# Patient Record
Sex: Female | Born: 1955 | Race: Black or African American | Hispanic: No | Marital: Married | Smoking: Never smoker
Health system: Southern US, Community
[De-identification: ages and names within clinical notes are randomized; demographics above are authoritative.]

## PROBLEM LIST (undated history)

## (undated) DIAGNOSIS — F319 Bipolar disorder, unspecified: Secondary | ICD-10-CM

## (undated) DIAGNOSIS — F209 Schizophrenia, unspecified: Secondary | ICD-10-CM

## (undated) DIAGNOSIS — E785 Hyperlipidemia, unspecified: Secondary | ICD-10-CM

## (undated) DIAGNOSIS — I10 Essential (primary) hypertension: Secondary | ICD-10-CM

## (undated) HISTORY — PX: TIBIA FRACTURE SURGERY: SHX806

## (undated) HISTORY — PX: TUBAL LIGATION: SHX77

## (undated) HISTORY — PX: OTHER SURGICAL HISTORY: SHX169

---

## 2011-08-26 ENCOUNTER — Encounter (HOSPITAL_COMMUNITY): Payer: Self-pay | Admitting: *Deleted

## 2011-08-26 ENCOUNTER — Emergency Department (HOSPITAL_COMMUNITY)
Admission: EM | Admit: 2011-08-26 | Discharge: 2011-08-26 | Disposition: A | Discharge status: Home / Self Care | Payer: Medicare (Managed Care) | Attending: Emergency Medicine | Admitting: Emergency Medicine

## 2011-08-26 ENCOUNTER — Emergency Department (HOSPITAL_COMMUNITY): Payer: Medicare (Managed Care)

## 2011-08-26 DIAGNOSIS — M25579 Pain in unspecified ankle and joints of unspecified foot: Secondary | ICD-10-CM | POA: Insufficient documentation

## 2011-08-26 DIAGNOSIS — S92309A Fracture of unspecified metatarsal bone(s), unspecified foot, initial encounter for closed fracture: Secondary | ICD-10-CM | POA: Insufficient documentation

## 2011-08-26 DIAGNOSIS — M79609 Pain in unspecified limb: Secondary | ICD-10-CM | POA: Insufficient documentation

## 2011-08-26 DIAGNOSIS — S93409A Sprain of unspecified ligament of unspecified ankle, initial encounter: Secondary | ICD-10-CM | POA: Insufficient documentation

## 2011-08-26 DIAGNOSIS — S93402A Sprain of unspecified ligament of left ankle, initial encounter: Secondary | ICD-10-CM

## 2011-08-26 DIAGNOSIS — W19XXXA Unspecified fall, initial encounter: Secondary | ICD-10-CM

## 2011-08-26 DIAGNOSIS — F319 Bipolar disorder, unspecified: Secondary | ICD-10-CM | POA: Insufficient documentation

## 2011-08-26 DIAGNOSIS — M25519 Pain in unspecified shoulder: Secondary | ICD-10-CM | POA: Insufficient documentation

## 2011-08-26 DIAGNOSIS — I10 Essential (primary) hypertension: Secondary | ICD-10-CM | POA: Insufficient documentation

## 2011-08-26 DIAGNOSIS — W108XXA Fall (on) (from) other stairs and steps, initial encounter: Secondary | ICD-10-CM | POA: Insufficient documentation

## 2011-08-26 DIAGNOSIS — S43402A Unspecified sprain of left shoulder joint, initial encounter: Secondary | ICD-10-CM

## 2011-08-26 DIAGNOSIS — IMO0002 Reserved for concepts with insufficient information to code with codable children: Secondary | ICD-10-CM | POA: Insufficient documentation

## 2011-08-26 HISTORY — DX: Bipolar disorder, unspecified: F31.9

## 2011-08-26 HISTORY — DX: Essential (primary) hypertension: I10

## 2011-08-26 MED ORDER — HYDROCODONE-ACETAMINOPHEN 5-325 MG PO TABS: 1.0000 | ORAL_TABLET | ORAL | Status: AC | PRN

## 2011-08-26 MED ORDER — IBUPROFEN 800 MG PO TABS
800.0000 mg | ORAL_TABLET | Freq: Once | ORAL | Status: DC
  Filled 2011-08-26: qty 1

## 2011-08-26 MED ORDER — HYDROCODONE-ACETAMINOPHEN 5-325 MG PO TABS
1.0000 | ORAL_TABLET | Freq: Once | ORAL | Status: AC
  Administered 2011-08-26: 1 via ORAL
  Filled 2011-08-26: qty 1

## 2011-08-26 MED ORDER — DIAZEPAM 5 MG PO TABS
5.0000 mg | ORAL_TABLET | Freq: Once | ORAL | Status: AC
  Administered 2011-08-26: 5 mg via ORAL
  Filled 2011-08-26: qty 1

## 2011-08-26 NOTE — Progress Notes (Signed)
Orthopedic Tech Progress Note Patient Details:  Dorothy Moran 1956/04/23 [id number]  Other Ortho Devices Type of Ortho Device: Postop boot;Crutches Ortho Device Location: left foot Ortho Device Interventions: Application   Nikki Dom 08/26/2011, 11:15 PM

## 2011-08-26 NOTE — Discharge Instructions (Signed)
Please review the instructions below. The x-ray tonight shows that you do have a broken bone in your left foot just beneath your left little toe. This break is nondisplaced and will probably heal without problems. The x-ray of your shoulder anf your left ankle were negative for any broken bones. Keep the ace wrap and postop boot in place and use crutches to assist with weightbearing until the pain in your left foot improves. Review the RICE instructions below. Take the medication for pain as directed. You'll need to call Dr. Shelle Iron on Monday to arrange follow up for a recheck. Return if you have worsening symptoms otherwise followup as discussed.     Ankle Sprain An ankle sprain happens when the bands of tissue that hold the ankle bones together (ligaments) stretch too much and tear.  HOME CARE   Put ice on the ankle for 15 to 20 minutes, 3 to 4 times a day.   Put ice in a plastic bag.   Place a towel between your skin and the bag.   You may stop icing when the puffiness (swelling) goes down.   Raise (elevate) the injured ankle to lessen puffiness.   Use crutches if your doctor tells you to. Use them until you can walk without pain.   If a plaster splint was applied:   Rest the plaster splint on nothing harder than a pillow for 24 hours.   Do not put weight on it.   Do not get it wet.   Take it off to shower or bathe.   Follow up with your doctor.   Use an elastic wrap for support. Take the wrap off if the toes lose feeling (numb), tingle, or turn cold or blue.   If an air splint was applied:   Add or release air to make it comfortable.   Take it off at night and to shower and bathe.   Wiggle your toes while wearing the air splint.   Only take medicine as told by your doctor.   Do not drive until your doctor says it is okay.  GET HELP RIGHT AWAY IF:   You have more bruising, puffiness, or pain.   Your toes feel cold.   Your medicine does not help lessen your  pain.   You are losing feeling in your toes or they turn blue.   You have severe pain.  MAKE SURE YOU:   Understand these instructions.   Will watch your condition.   Will get help right away if you are not doing well or get worse.  Document Released: 11/14/2007 Document Revised: 05/17/2011 Document Reviewed: 11/14/2007 [facility] Patient Information 2012 [place], [place].Foot Fracture A fractured foot is a broken bone in your foot. These fractures are usually caused by twisting or crush injuries. Some foot fractures are stress fractures which are due to excess walking or exercise. If the bones are in a good position, foot fractures will usually heal in about 6 weeks. You should keep your foot elevated for the next 2 to 4 days and apply ice packs to the area of the injury for 20 to 30 minutes every 2 to 3 hours until the swelling and pain get better. If you have been placed in a cast or splint, keep it on until you have been checked by your caregiver. Do not walk on a broken foot until bearing weight is relatively painless. Often a cast or podiatric shoe with a stiff sole is used to allow early walking.  Repeat X-rays are often needed in 3 to 6 weeks to make sure the fracture is healing. Follow up with your caregiver as recommended.  SEEK IMMEDIATE MEDICAL CARE IF:  You have increased pain, or your toes become cold, numb, or pale.  MAKE SURE YOU:   Understand these instructions.   Will watch your condition.   Will get help right away if you are not doing well or get worse.  Document Released: 07/05/2004 Document Revised: 05/17/2011 Document Reviewed: 06/30/2008 [facility] Patient Information 2012 [place], [place].Metatarsal Fracture, Undisplaced A metatarsal fracture is a break in the bone(s) of the foot. These are the bones of the foot that connect your toes to the bones of the ankle. DIAGNOSIS  The diagnoses of these fractures are usually made with X-rays. If there are problems in the  forefoot and x-rays are normal a later bone scan will usually make the diagnosis.  TREATMENT AND HOME CARE INSTRUCTIONS  Treatment may or may not include a cast or walking shoe. When casts are needed the use is usually for short periods of time so as not to slow down healing with muscle wasting (atrophy).   Activities should be stopped until further advised by your caregiver.   Wear shoes with adequate shock absorbing capabilities and stiff soles.   Alternative exercise may be undertaken while waiting for healing. These may include bicycling and swimming, or as your caregiver suggests.   It is important to keep all follow-up visits or specialty referrals. The failure to keep these appointments could result in improper bone healing and chronic pain or disability.   Warning: Do not drive a car or operate a motor vehicle until your caregiver specifically tells you it is safe to do so.  IF YOU DO NOT HAVE A CAST OR SPLINT:  You may walk on your injured foot as tolerated or advised.   Do not put any weight on your injured foot for as long as directed by your caregiver. Slowly increase the amount of time you walk on the foot as the pain allows or as advised.   Use crutches until you can bear weight without pain. A gradual increase in weight bearing may help.   Apply ice to the injury for 15 to 20 minutes each hour while awake for the first 2 days. Put the ice in a plastic bag and place a towel between the bag of ice and your skin.   Only take over-the-counter or prescription medicines for pain, discomfort, or fever as directed by your caregiver.  SEEK IMMEDIATE MEDICAL CARE IF:   Your cast gets damaged or breaks.   You have continued severe pain or more swelling than you did before the cast was put on, or the pain is not controlled with medications.   Your skin or nails below the injury turn blue or grey, or feel cold or numb.   There is a bad smell, or new stains or pus-like (purulent)  drainage coming from the cast.  MAKE SURE YOU:   Understand these instructions.   Will watch your condition.   Will get help right away if you are not doing well or get worse.  Document Released: 02/17/2002 Document Revised: 05/17/2011 Document Reviewed: 01/09/2008 Shoulder Sprain A shoulder sprain is the result of damage to the tough, fiber-like tissues (ligaments) that help hold your shoulder in place. The ligaments may be stretched or torn. Besides the main shoulder joint (the ball and socket), there are several smaller joints that connect the bones  in this area. A sprain usually involves one of those joints. Most often it is the acromioclavicular (or AC) joint. That is the joint that connects the collarbone (clavicle) and the shoulder blade (scapula) at the top point of the shoulder blade (acromion). A shoulder sprain is a mild form of what is called a shoulder separation. Recovering from a shoulder sprain may take some time. For some, pain lingers for several months. Most people recover without long term problems. CAUSES   A shoulder sprain is usually caused by some kind of trauma. This might be:   Falling on an outstretched arm.   Being hit hard on the shoulder.   Twisting the arm.   Shoulder sprains are more likely to occur in people who:   Play sports.   Have balance or coordination problems.  SYMPTOMS   Pain when you move your shoulder.   Limited ability to move the shoulder.   Swelling and tenderness on top of the shoulder.   Redness or warmth in the shoulder.   Bruising.   A change in the shape of the shoulder.  DIAGNOSIS  Your healthcare provider may:  Ask about your symptoms.   Ask about recent activity that might have caused those symptoms.   Examine your shoulder. You may be asked to do simple exercises to test movement. The other shoulder will be examined for comparison.   Order some tests that provide a look inside the body. They can show the extent  of the injury. The tests could include:   X-rays.   CT (computed tomography) scan.   MRI (magnetic resonance imaging) scan.  RISKS AND COMPLICATIONS  Loss of full shoulder motion.   Ongoing shoulder pain.  TREATMENT  How long it takes to recover from a shoulder sprain depends on how severe it was. Treatment options may include:  Rest. You should not use the arm or shoulder until it heals.   Ice. For 2 or 3 days after the injury, put an ice pack on the shoulder up to 4 times a day. It should stay on for 15 to 20 minutes each time. Wrap the ice in a towel so it does not touch your skin.   Over-the-counter medicine to relieve pain.   A sling or brace. This will keep the arm still while the shoulder is healing.   Physical therapy or rehabilitation exercises. These will help you regain strength and motion. Ask your healthcare provider when it is OK to begin these exercises.   Surgery. The need for surgery is rare with a sprained shoulder, but some people may need surgery to keep the joint in place and reduce pain.  HOME CARE INSTRUCTIONS   Ask your healthcare provider about what you should and should not do while your shoulder heals.   Make sure you know how to apply ice to the correct area of your shoulder.   Talk with your healthcare provider about which medications should be used for pain and swelling.   If rehabilitation therapy will be needed, ask your healthcare provider to refer you to a therapist. If it is not recommended, then ask about at-home exercises. Find out when exercise should begin.  SEEK MEDICAL CARE IF:  Your pain, swelling, or redness at the joint increases. SEEK IMMEDIATE MEDICAL CARE IF:   You have a fever.   You cannot move your arm or shoulder.  Document Released: 10/14/2008 Document Revised: 05/17/2011 Document Reviewed: 10/14/2008 [employer] Patient Information 2012 [employer], Shoulder Sprain A shoulder  sprain is the result of damage to the tough,  fiber-like tissues (ligaments) that help hold your shoulder in place. The ligaments may be stretched or torn. Besides the main shoulder joint (the ball and socket), there are several smaller joints that connect the bones in this area. A sprain usually involves one of those joints. Most often it is the acromioclavicular (or AC) joint. That is the joint that connects the collarbone (clavicle) and the shoulder blade (scapula) at the top point of the shoulder blade (acromion). A shoulder sprain is a mild form of what is called a shoulder separation. Recovering from a shoulder sprain may take some time. For some, pain lingers for several months. Most people recover without long term problems. CAUSES   A shoulder sprain is usually caused by some kind of trauma. This might be:   Falling on an outstretched arm.   Being hit hard on the shoulder.   Twisting the arm.   Shoulder sprains are more likely to occur in people who:   Play sports.   Have balance or coordination problems.  SYMPTOMS   Pain when you move your shoulder.   Limited ability to move the shoulder.   Swelling and tenderness on top of the shoulder.   Redness or warmth in the shoulder.   Bruising.   A change in the shape of the shoulder.  DIAGNOSIS  Your healthcare provider may:  Ask about your symptoms.   Ask about recent activity that might have caused those symptoms.   Examine your shoulder. You may be asked to do simple exercises to test movement. The other shoulder will be examined for comparison.   Order some tests that provide a look inside the body. They can show the extent of the injury. The tests could include:   X-rays.   CT (computed tomography) scan.   MRI (magnetic resonance imaging) scan.  RISKS AND COMPLICATIONS  Loss of full shoulder motion.   Ongoing shoulder pain.  TREATMENT  How long it takes to recover from a shoulder sprain depends on how severe it was. Treatment options may  include:  Rest. You should not use the arm or shoulder until it heals.   Ice. For 2 or 3 days after the injury, put an ice pack on the shoulder up to 4 times a day. It should stay on for 15 to 20 minutes each time. Wrap the ice in a towel so it does not touch your skin.   Over-the-counter medicine to relieve pain.   A sling or brace. This will keep the arm still while the shoulder is healing.   Physical therapy or rehabilitation exercises. These will help you regain strength and motion. Ask your healthcare provider when it is OK to begin these exercises.   Surgery. The need for surgery is rare with a sprained shoulder, but some people may need surgery to keep the joint in place and reduce pain.  HOME CARE INSTRUCTIONS   Ask your healthcare provider about what you should and should not do while your shoulder heals.   Make sure you know how to apply ice to the correct area of your shoulder.   Talk with your healthcare provider about which medications should be used for pain and swelling.   If rehabilitation therapy will be needed, ask your healthcare provider to refer you to a therapist. If it is not recommended, then ask about at-home exercises. Find out when exercise should begin.  SEEK MEDICAL CARE IF:  Your pain, swelling,  or redness at the joint increases. SEEK IMMEDIATE MEDICAL CARE IF:   You have a fever.   You cannot move your arm or shoulder.  Document Released: 10/14/2008 Document Revised: 05/17/2011 Document Reviewed: 10/14/2008 [facility] Patient Information 2012 [employer].Shoulder Sprain A shoulder sprain is the result of damage to the tough, fiber-like tissues (ligaments) that help hold your shoulder in place. The ligaments may be stretched or torn. Besides the main shoulder joint (the ball and socket), there are several smaller joints that connect the bones in this area. A sprain usually involves one of those joints. Most often it is the acromioclavicular (or AC)  joint. That is the joint that connects the collarbone (clavicle) and the shoulder blade (scapula) at the top point of the shoulder blade (acromion). A shoulder sprain is a mild form of what is called a shoulder separation. Recovering from a shoulder sprain may take some time. For some, pain lingers for several months. Most people recover without long term problems. CAUSES   A shoulder sprain is usually caused by some kind of trauma. This might be:   Falling on an outstretched arm.   Being hit hard on the shoulder.   Twisting the arm.   Shoulder sprains are more likely to occur in people who:   Play sports.   Have balance or coordination problems.  SYMPTOMS   Pain when you move your shoulder.   Limited ability to move the shoulder.   Swelling and tenderness on top of the shoulder.   Redness or warmth in the shoulder.   Bruising.   A change in the shape of the shoulder.  DIAGNOSIS  Your healthcare provider may:  Ask about your symptoms.   Ask about recent activity that might have caused those symptoms.   Examine your shoulder. You may be asked to do simple exercises to test movement. The other shoulder will be examined for comparison.   Order some tests that provide a look inside the body. They can show the extent of the injury. The tests could include:   X-rays.   CT (computed tomography) scan.   MRI (magnetic resonance imaging) scan.  RISKS AND COMPLICATIONS  Loss of full shoulder motion.   Ongoing shoulder pain.  TREATMENT  How long it takes to recover from a shoulder sprain depends on how severe it was. Treatment options may include:  Rest. You should not use the arm or shoulder until it heals.   Ice. For 2 or 3 days after the injury, put an ice pack on the shoulder up to 4 times a day. It should stay on for 15 to 20 minutes each time. Wrap the ice in a towel so it does not touch your skin.   Over-the-counter medicine to relieve pain.   A sling or brace.  This will keep the arm still while the shoulder is healing.   Physical therapy or rehabilitation exercises. These will help you regain strength and motion. Ask your healthcare provider when it is OK to begin these exercises.   Surgery. The need for surgery is rare with a sprained shoulder, but some people may need surgery to keep the joint in place and reduce pain.  HOME CARE INSTRUCTIONS   Ask your healthcare provider about what you should and should not do while your shoulder heals.   Make sure you know how to apply ice to the correct area of your shoulder.   Talk with your healthcare provider about which medications should be used for pain  and swelling.   If rehabilitation therapy will be needed, ask your healthcare provider to refer you to a therapist. If it is not recommended, then ask about at-home exercises. Find out when exercise should begin.  SEEK MEDICAL CARE IF:  Your pain, swelling, or redness at the joint increases. SEEK IMMEDIATE MEDICAL CARE IF:   You have a fever.   You cannot move your arm or shoulder.  Document Released: 10/14/2008 Document Revised: 05/17/2011 Document Reviewed: 10/14/2008 [facility] Patient Information 2012 [place], [place].

## 2011-08-26 NOTE — ED Provider Notes (Signed)
History     CSN: [id number]  Arrival date & time 08/26/11  4098   First MD Initiated Contact with Patient 08/26/11 2051      Chief Complaint  Patient presents with  . Leg Injury    HPI: Patient is a 56 y.o. female presenting with fall. The history is provided by the patient.  Fall The accident occurred yesterday. The fall occurred while walking. She fell from a height of 3 to 5 ft. She landed on concrete. The point of impact was the left shoulder. The pain is present in the left shoulder. The pain is at a severity of 10/10. The pain is severe. She was ambulatory at the scene. There was no entrapment after the fall. There was no drug use involved in the accident. There was no alcohol use involved in the accident. Pertinent negatives include no numbness and no tingling. The symptoms are aggravated by standing and pressure on the injury.  Patient reports that she fell yesterday while walking. States she tripped over a small step that she did not see, fell to her left ultimately landing on her left shoulder. Patient now complains of pain to the left shoulder, left ankle, and foot. Reports previous surgery to the left ankle so she is concerned about reinjury.  Past Medical History  Diagnosis Date  . Hypertension   . Bipolar 1 disorder     No past surgical history on file.  History reviewed. No pertinent family history.  History  Substance Use Topics  . Smoking status: Never Smoker   . Smokeless tobacco: Not on file  . Alcohol Use: No    OB History    Grav Para Term Preterm Abortions TAB SAB Ect Mult Living                  Review of Systems  Constitutional: Negative.   HENT: Negative.   Eyes: Negative.   Respiratory: Negative.   Cardiovascular: Negative.   Gastrointestinal: Negative.   Genitourinary: Negative.   Musculoskeletal: Negative.   Skin: Negative.   Neurological: Negative.  Negative for tingling and numbness.  Hematological: Negative.     Psychiatric/Behavioral: Negative.     Allergies  Ibuprofen and Aspirin  Home Medications   Current Outpatient Rx  Name Route Sig Dispense Refill  . AMLODIPINE BESYLATE 5 MG PO TABS Oral Take 5 mg by mouth daily.    . ASPIRIN 81 MG PO CHEW Oral Chew 81 mg by mouth daily.    Marland Kitchen DIVALPROEX SODIUM ER 500 MG PO TB24 Oral Take 500 mg by mouth daily.    Marland Kitchen ESOMEPRAZOLE MAGNESIUM 40 MG PO CPDR Oral Take 40 mg by mouth daily before breakfast.    . ISOSORBIDE DINITRATE 30 MG PO TABS Oral Take 30 mg by mouth 2 (two) times daily.    . ADULT MULTIVITAMIN W/MINERALS CH Oral Take 1 tablet by mouth daily.    . QUETIAPINE FUMARATE 100 MG PO TABS Oral Take 100 mg by mouth at bedtime.    Marland Kitchen ROSUVASTATIN CALCIUM 20 MG PO TABS Oral Take 20 mg by mouth daily.    . TRAMADOL HCL 50 MG PO TABS Oral Take 50 mg by mouth every 6 (six) hours as needed. For pain    . TRAZODONE HCL 50 MG PO TABS Oral Take 50 mg by mouth at bedtime.    . VENLAFAXINE HCL ER 150 MG PO CP24 Oral Take 150 mg by mouth daily.      BP 139/88  Pulse  78  Temp(Src) 97.9 F (36.6 C) (Oral)  Resp 16  SpO2 95%  Physical Exam  Constitutional: She is oriented to person, place, and time. She appears well-developed and well-nourished.  HENT:  Head: Normocephalic and atraumatic.  Eyes: Conjunctivae are normal.  Neck: Neck supple.  Cardiovascular: Normal rate and regular rhythm.   Pulmonary/Chest: Effort normal and breath sounds normal.  Abdominal: Soft. Bowel sounds are normal.  Musculoskeletal: Normal range of motion.       Arms:      Feet:       Patient reports TTP to left shoulder and pain with passive range of motion.  Neurological: She is alert and oriented to person, place, and time.  Skin: Skin is warm and dry. No erythema.  Psychiatric: She has a normal mood and affect.    ED Course  Procedures   Patient reports pain improved with medication. Findings and clinical impression discussed with patient. Will place an Ace wrap  and postop boot and the patient for crutches to assist in weightbearing. Will plan for discharge home with medication for pain and or the referral. Patient agreeable with plan. I have also discussed with patient Dr. Ignacia Palma who is in agreement with plan.  Labs Reviewed - No data to display No results found.   No diagnosis found.    MDM  HPI/PE and clinical impression discussed   1. Fra mechanical fall  2. Fracture of fifth metatarsal ( nondisplaced)  3. Left ankle sprain 4. Left shoulder sprain  ACE and postop boot placed to left ankle/foot and patient fitted for crutches. She was discharged home with medication for pain or the referral.   Medical screening examination/treatment/procedure(s) were performed by non-physician practitioner and as supervising physician I was immediately available for consultation/collaboration. I reviewed pt's x-ray with [name], and advised treatment. Dorothy Moran, M.D.      Dorothy Kayser Schorr, NP 08/26/11 2328  Carleene Cooper III, MD 08/28/11 1215

## 2011-08-26 NOTE — ED Notes (Signed)
She fell yesterday and she has pain in her lt shoulder and her lt lower extremity pain

## 2013-01-10 IMAGING — CR DG SHOULDER 2+V*L*
3 series · 3 of 3 positions shown · non-contrast
Comparison: None

CLINICAL DATA: Leg injury

LEFT SHOULDER - 2+ VIEW

[w shoulder ap internal left]
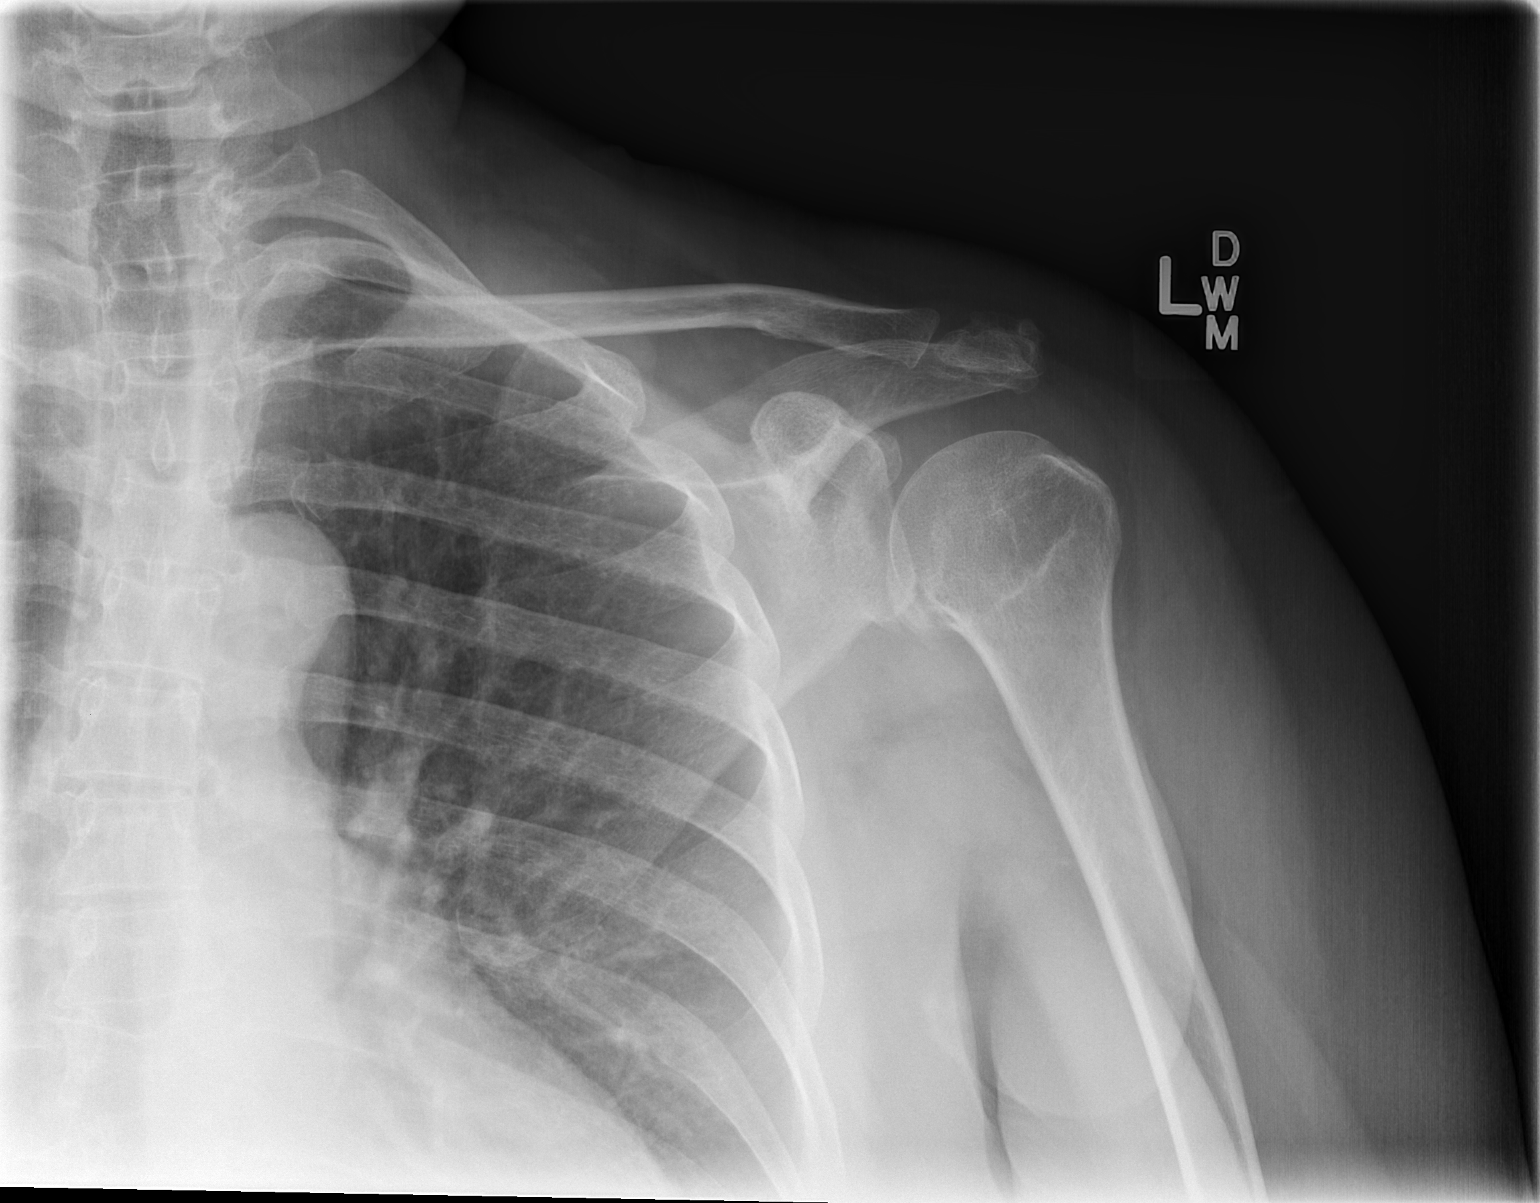

[w shoulder ap external left]
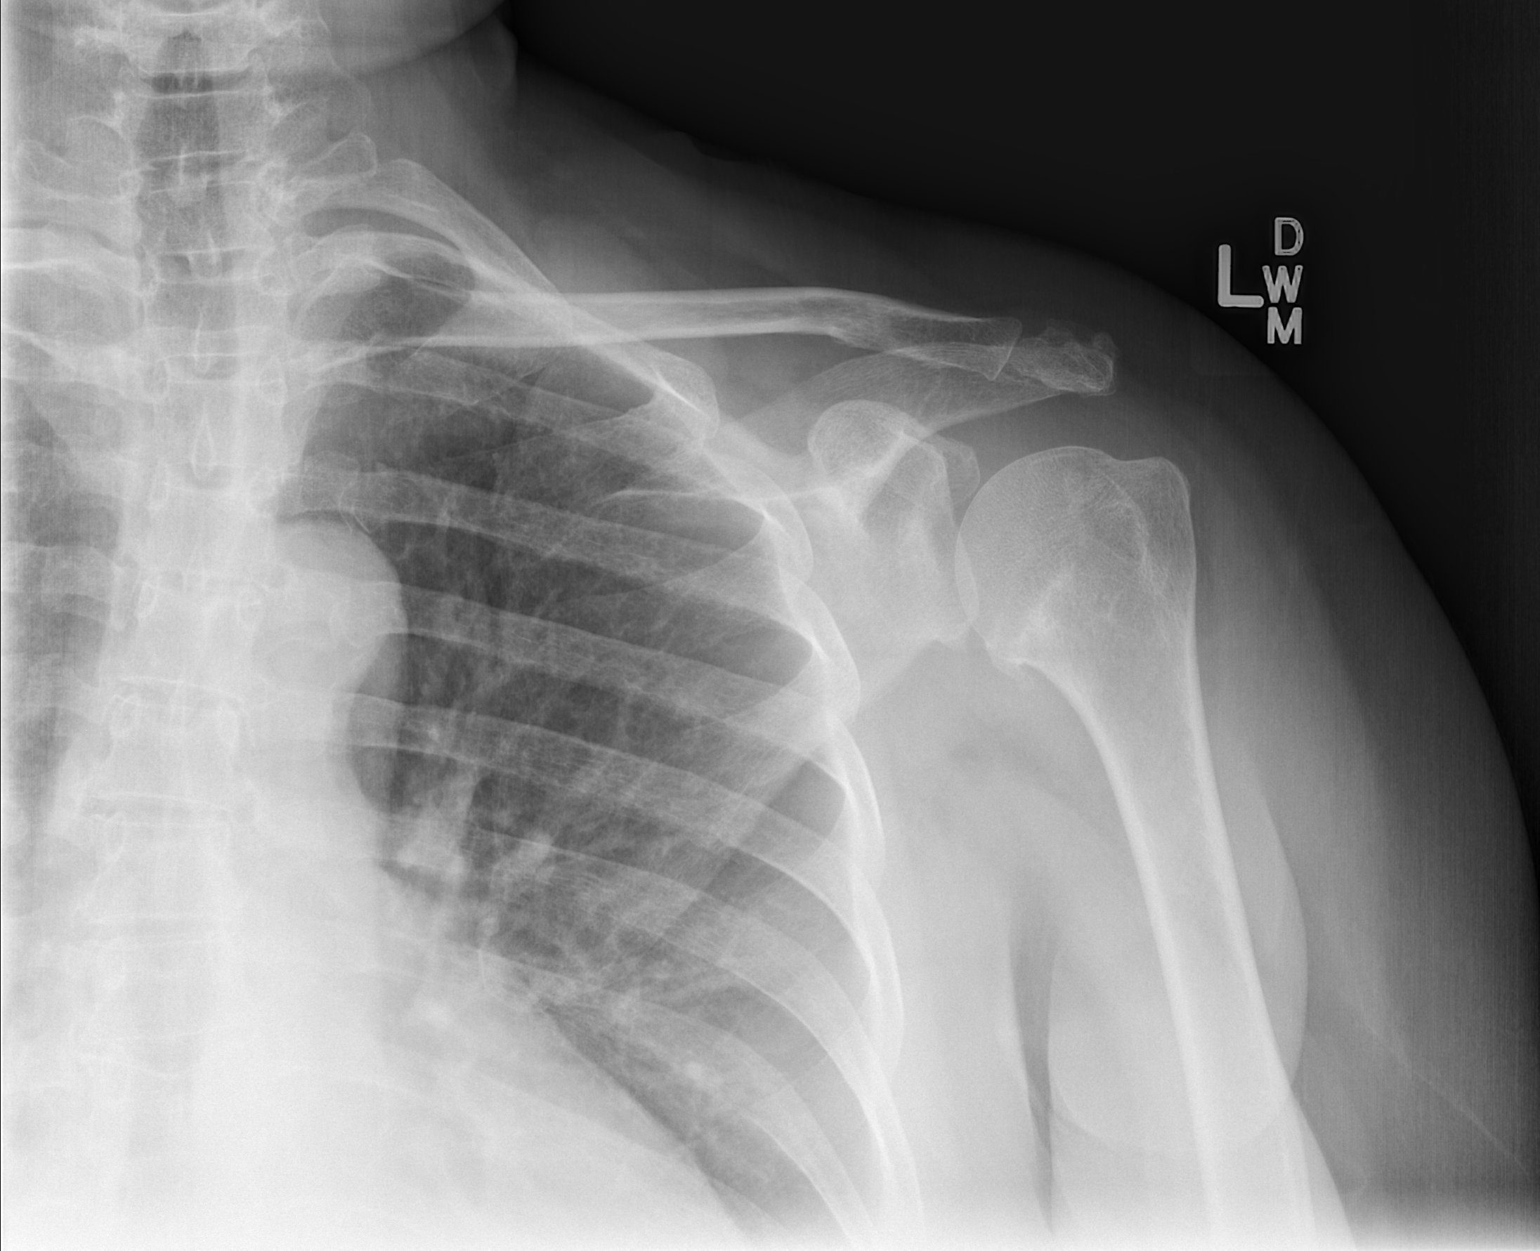

[w shoulder y view left]
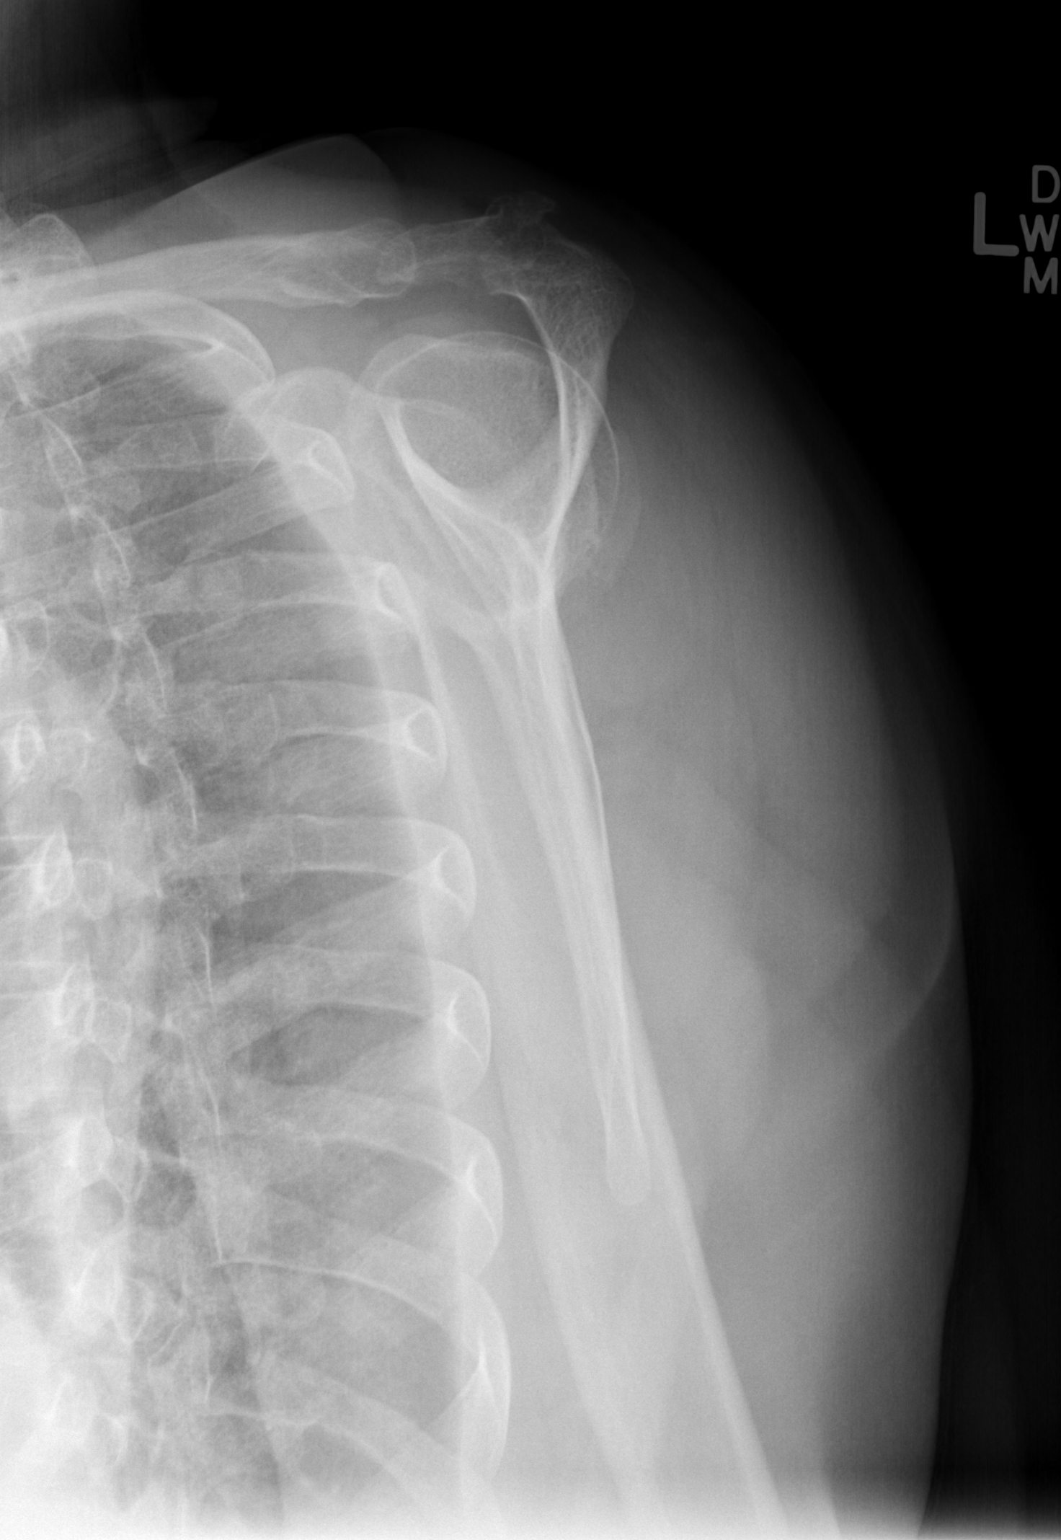

[3 of 3 positions shown; findings below may reference images not displayed]

FINDINGS: Degenerative type changes are noted involving the AC
joint and glenohumeral joint.

No acute fracture or subluxation identified.

No radiopaque foreign bodies or soft tissue calcifications
identified.
IMPRESSION: 1.  No acute findings.
2.  Degenerative joint disease.

## 2013-01-10 IMAGING — CR DG ANKLE COMPLETE 3+V*L*
3 series · 3 of 3 positions shown · non-contrast
Comparison: None

CLINICAL DATA: Leg injury

LEFT ANKLE COMPLETE - 3+ VIEW

[t ankle joint ap left]
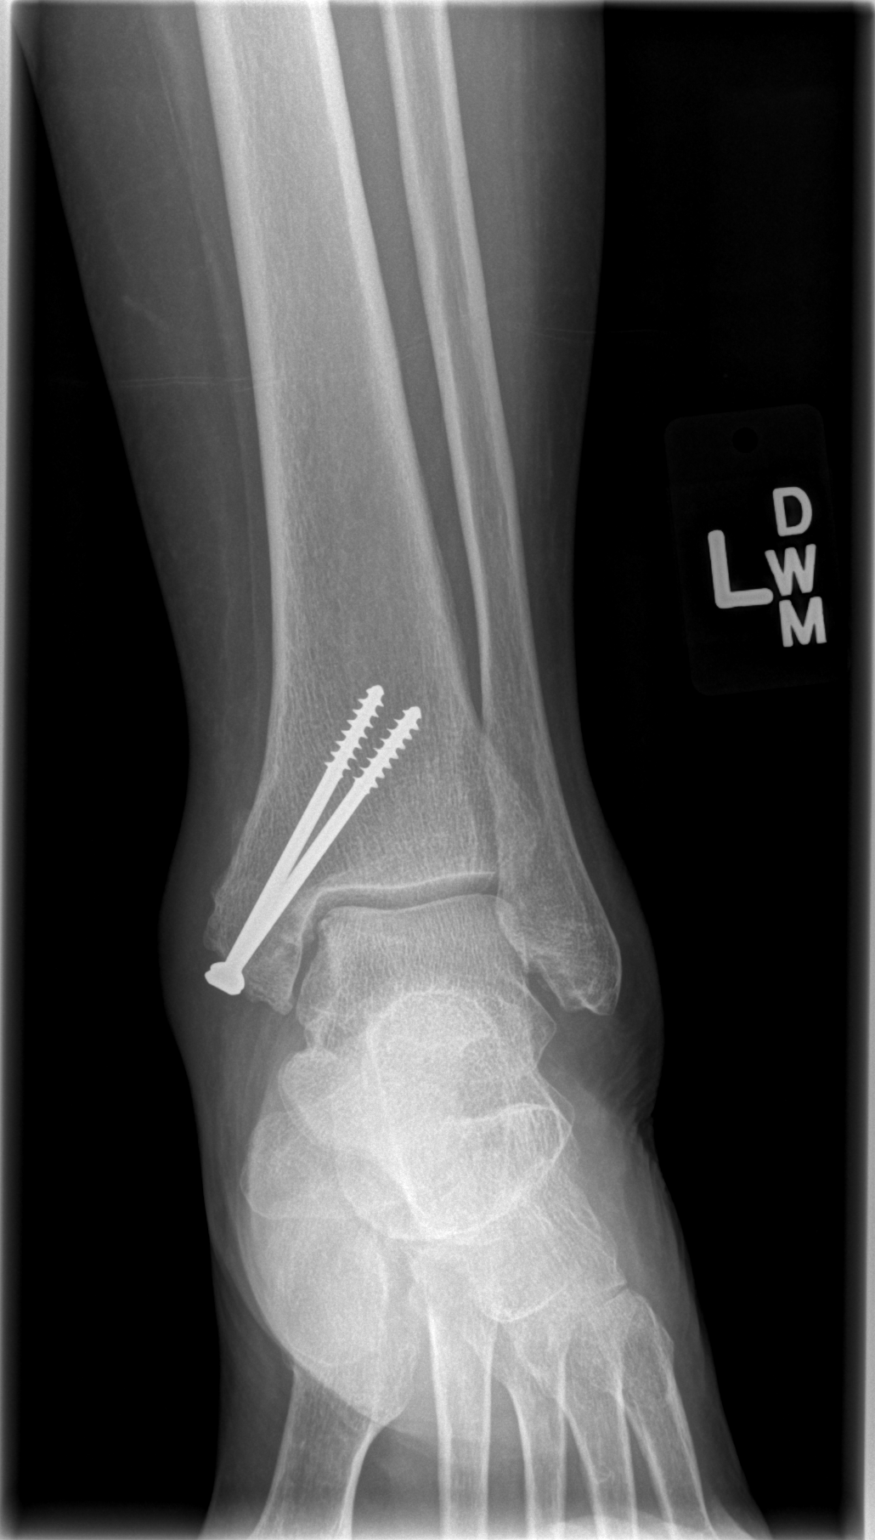

[t ankle joint oblique left]
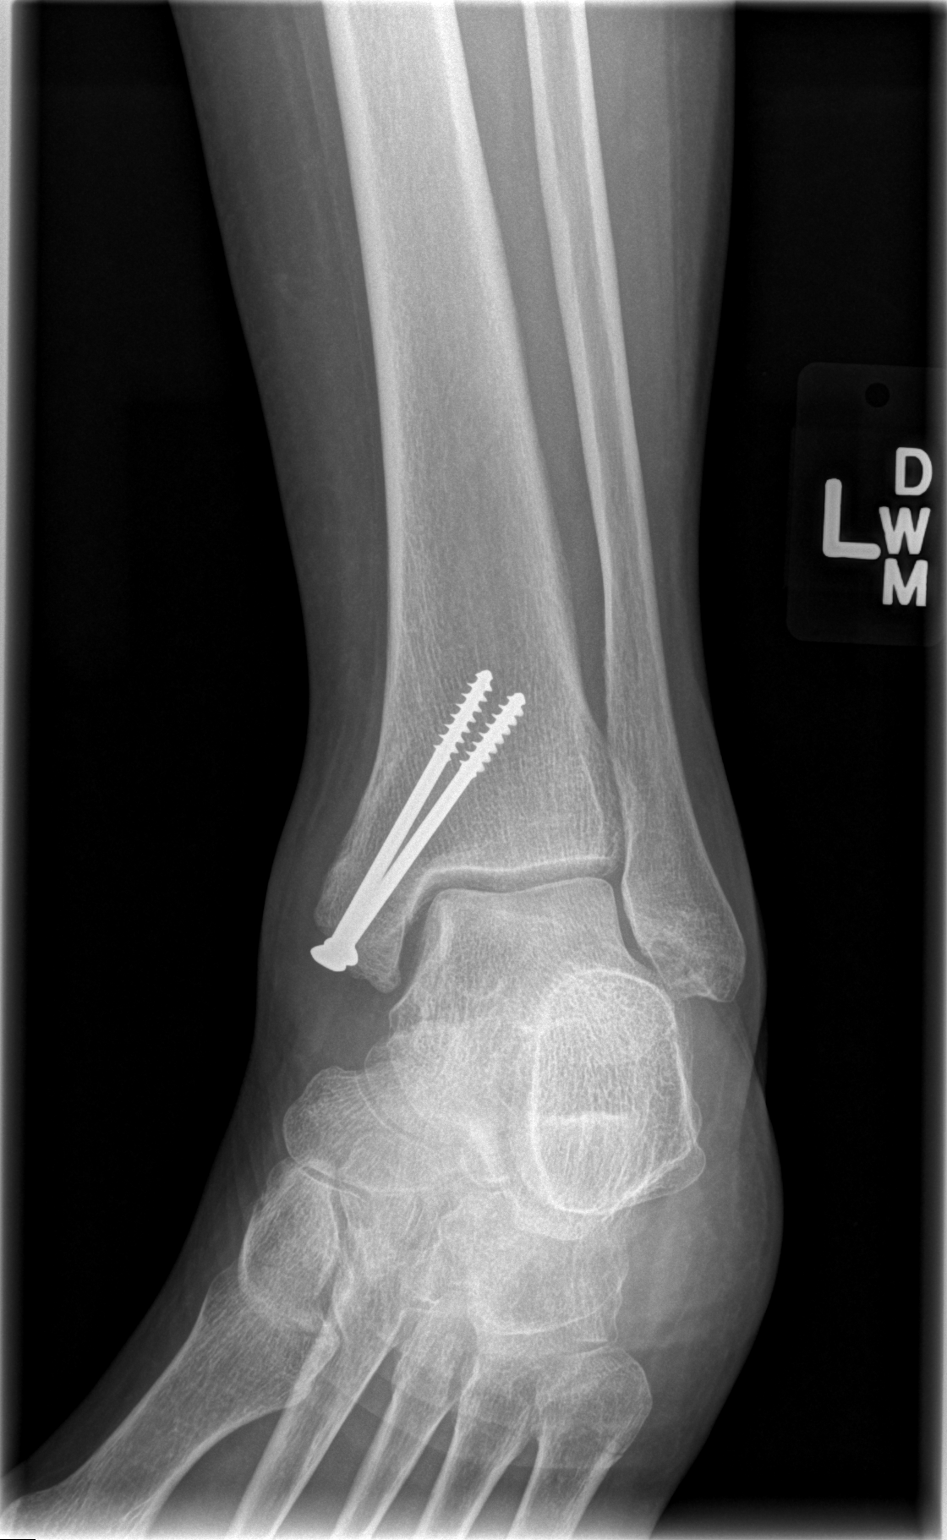

[t ankle joint lat left]
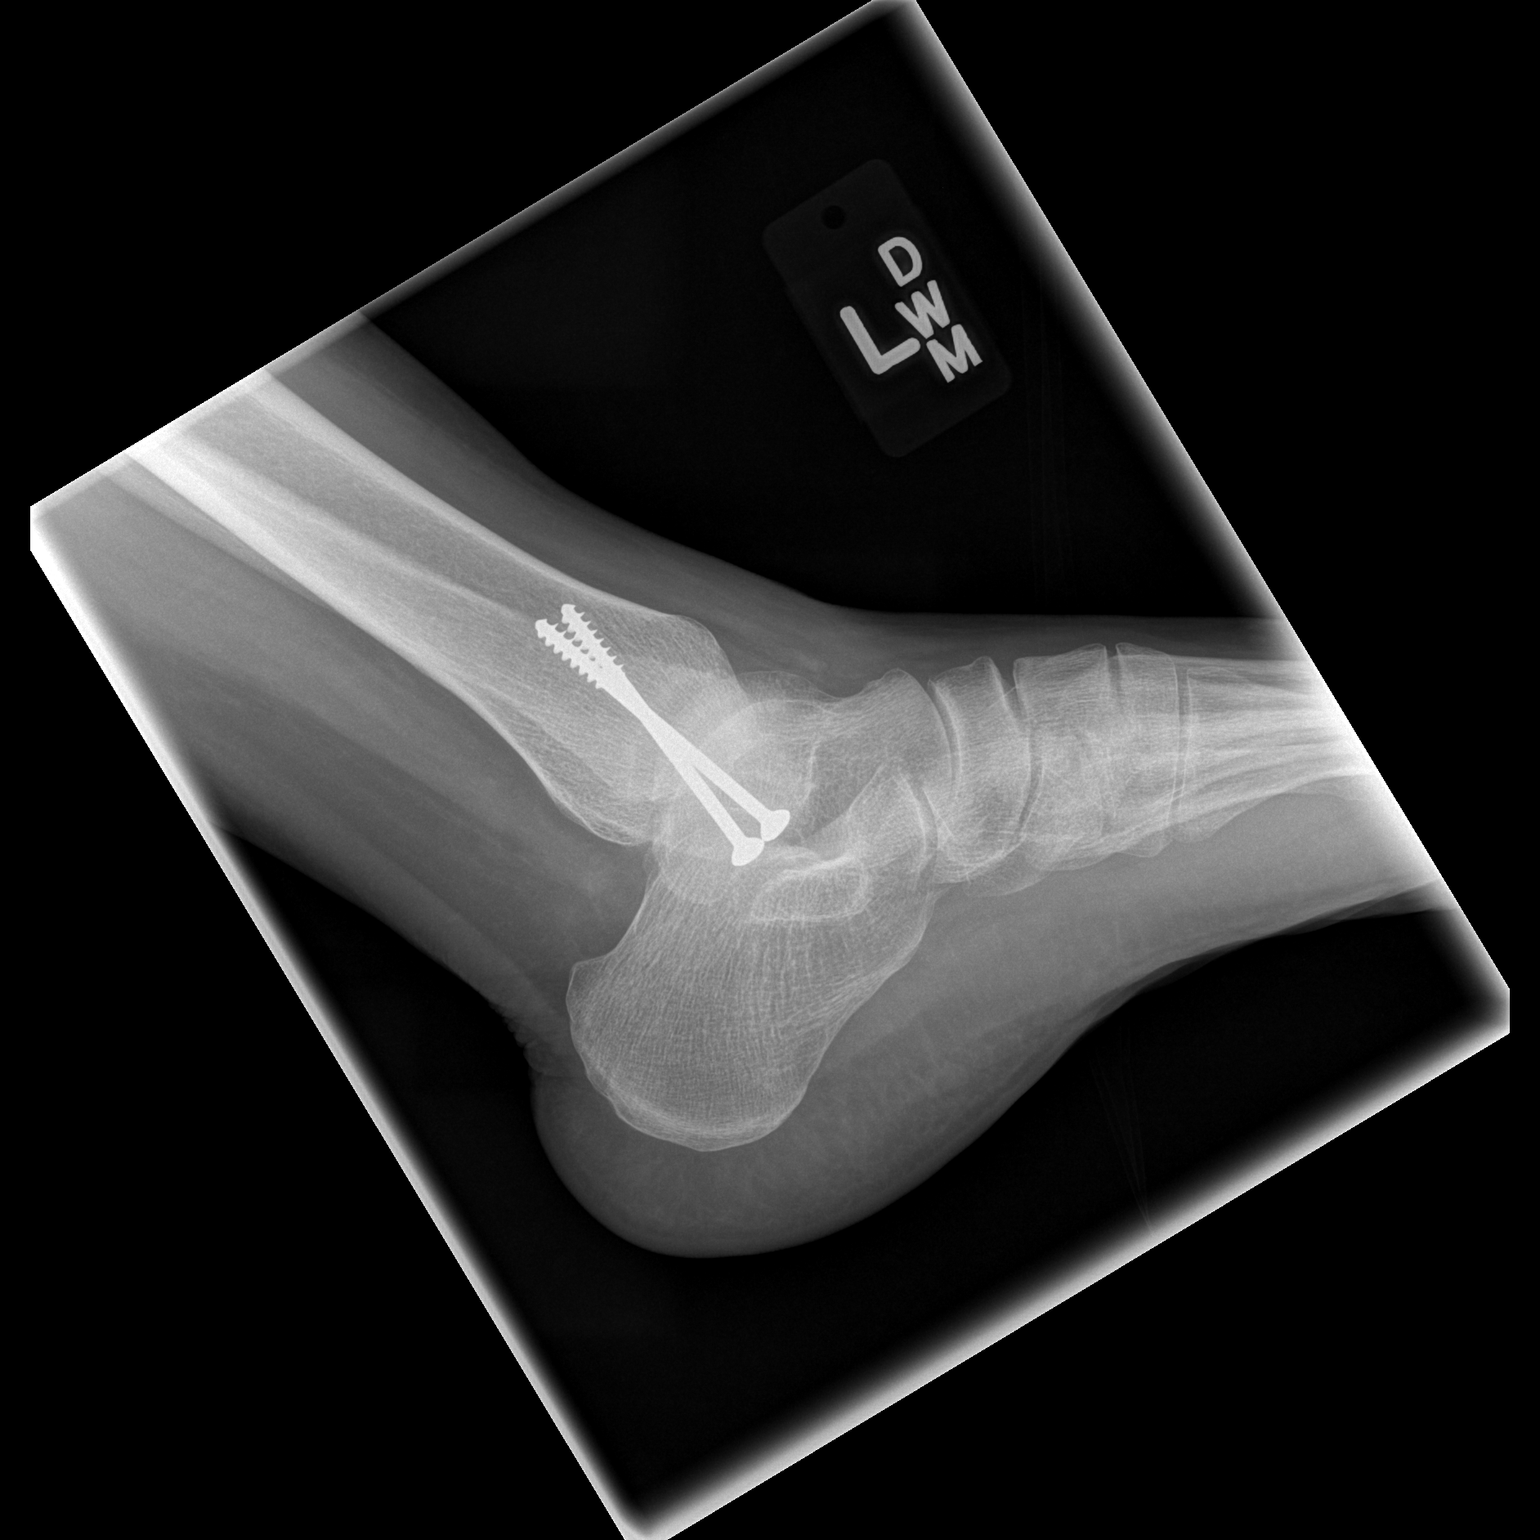

[3 of 3 positions shown; findings below may reference images not displayed]

FINDINGS: There are two screws is identified within the medial
malleolus.

No acute fracture or subluxation identified.  The soft tissues are
unremarkable.
IMPRESSION: 1.  No acute findings.
2.  Prior ORIF of the medial malleolus.

## 2018-12-27 ENCOUNTER — Encounter (HOSPITAL_COMMUNITY): Admission: AD | Disposition: A | Discharge status: Home / Self Care | Payer: Self-pay | Attending: Internal Medicine

## 2018-12-27 ENCOUNTER — Other Ambulatory Visit: Payer: Self-pay

## 2018-12-27 ENCOUNTER — Inpatient Hospital Stay (HOSPITAL_COMMUNITY)
Admission: AD | Admit: 2018-12-27 | Discharge: 2018-12-29 | DRG: 384 | Disposition: A | Discharge status: Home / Self Care | Payer: Medicare Other | Source: Other Acute Inpatient Hospital | Attending: Internal Medicine | Admitting: Internal Medicine

## 2018-12-27 ENCOUNTER — Inpatient Hospital Stay (HOSPITAL_COMMUNITY): Payer: Medicare Other | Admitting: Certified Registered Nurse Anesthetist

## 2018-12-27 ENCOUNTER — Encounter (HOSPITAL_COMMUNITY): Payer: Self-pay | Admitting: Internal Medicine

## 2018-12-27 DIAGNOSIS — I10 Essential (primary) hypertension: Secondary | ICD-10-CM | POA: Diagnosis not present

## 2018-12-27 DIAGNOSIS — N183 Chronic kidney disease, stage 3 (moderate): Secondary | ICD-10-CM | POA: Diagnosis present

## 2018-12-27 DIAGNOSIS — E785 Hyperlipidemia, unspecified: Secondary | ICD-10-CM | POA: Diagnosis present

## 2018-12-27 DIAGNOSIS — K922 Gastrointestinal hemorrhage, unspecified: Secondary | ICD-10-CM | POA: Diagnosis present

## 2018-12-27 DIAGNOSIS — K219 Gastro-esophageal reflux disease without esophagitis: Secondary | ICD-10-CM | POA: Diagnosis present

## 2018-12-27 DIAGNOSIS — D62 Acute posthemorrhagic anemia: Secondary | ICD-10-CM | POA: Diagnosis present

## 2018-12-27 DIAGNOSIS — N179 Acute kidney failure, unspecified: Secondary | ICD-10-CM | POA: Diagnosis present

## 2018-12-27 DIAGNOSIS — R740 Nonspecific elevation of levels of transaminase and lactic acid dehydrogenase [LDH]: Secondary | ICD-10-CM | POA: Diagnosis not present

## 2018-12-27 DIAGNOSIS — F209 Schizophrenia, unspecified: Secondary | ICD-10-CM | POA: Diagnosis present

## 2018-12-27 DIAGNOSIS — Z7982 Long term (current) use of aspirin: Secondary | ICD-10-CM

## 2018-12-27 DIAGNOSIS — Z8711 Personal history of peptic ulcer disease: Secondary | ICD-10-CM

## 2018-12-27 DIAGNOSIS — N390 Urinary tract infection, site not specified: Secondary | ICD-10-CM | POA: Diagnosis present

## 2018-12-27 DIAGNOSIS — Z888 Allergy status to other drugs, medicaments and biological substances status: Secondary | ICD-10-CM | POA: Diagnosis not present

## 2018-12-27 DIAGNOSIS — K2900 Acute gastritis without bleeding: Secondary | ICD-10-CM | POA: Diagnosis present

## 2018-12-27 DIAGNOSIS — K269 Duodenal ulcer, unspecified as acute or chronic, without hemorrhage or perforation: Principal | ICD-10-CM

## 2018-12-27 DIAGNOSIS — K222 Esophageal obstruction: Secondary | ICD-10-CM | POA: Diagnosis present

## 2018-12-27 DIAGNOSIS — F319 Bipolar disorder, unspecified: Secondary | ICD-10-CM | POA: Diagnosis present

## 2018-12-27 DIAGNOSIS — K920 Hematemesis: Secondary | ICD-10-CM

## 2018-12-27 DIAGNOSIS — Z79899 Other long term (current) drug therapy: Secondary | ICD-10-CM

## 2018-12-27 DIAGNOSIS — K260 Acute duodenal ulcer with hemorrhage: Secondary | ICD-10-CM

## 2018-12-27 DIAGNOSIS — Z1159 Encounter for screening for other viral diseases: Secondary | ICD-10-CM | POA: Diagnosis not present

## 2018-12-27 DIAGNOSIS — R7401 Elevation of levels of liver transaminase levels: Secondary | ICD-10-CM | POA: Diagnosis present

## 2018-12-27 DIAGNOSIS — Z886 Allergy status to analgesic agent status: Secondary | ICD-10-CM | POA: Diagnosis not present

## 2018-12-27 DIAGNOSIS — K2960 Other gastritis without bleeding: Secondary | ICD-10-CM

## 2018-12-27 DIAGNOSIS — I129 Hypertensive chronic kidney disease with stage 1 through stage 4 chronic kidney disease, or unspecified chronic kidney disease: Secondary | ICD-10-CM | POA: Diagnosis present

## 2018-12-27 HISTORY — PX: ESOPHAGOGASTRODUODENOSCOPY (EGD) WITH PROPOFOL: SHX5813

## 2018-12-27 HISTORY — PX: BIOPSY: SHX5522

## 2018-12-27 HISTORY — DX: Schizophrenia, unspecified: F20.9

## 2018-12-27 HISTORY — DX: Hyperlipidemia, unspecified: E78.5

## 2018-12-27 LAB — HEPATIC FUNCTION PANEL
ALT: 70 U/L — ABNORMAL HIGH (ref 0–44)
AST: 50 U/L — ABNORMAL HIGH (ref 15–41)
Albumin: 3.4 g/dL — ABNORMAL LOW (ref 3.5–5.0)
Alkaline Phosphatase: 63 U/L (ref 38–126)
Bilirubin, Direct: 0.1 mg/dL (ref 0.0–0.2)
Indirect Bilirubin: 0.6 mg/dL (ref 0.3–0.9)
Total Bilirubin: 0.7 mg/dL (ref 0.3–1.2)
Total Protein: 6.3 g/dL — ABNORMAL LOW (ref 6.5–8.1)

## 2018-12-27 LAB — BASIC METABOLIC PANEL
Anion gap: 9 (ref 5–15)
BUN: 61 mg/dL — ABNORMAL HIGH (ref 8–23)
CO2: 23 mmol/L (ref 22–32)
Calcium: 8.8 mg/dL — ABNORMAL LOW (ref 8.9–10.3)
Chloride: 108 mmol/L (ref 98–111)
Creatinine, Ser: 1.27 mg/dL — ABNORMAL HIGH (ref 0.44–1.00)
GFR calc Af Amer: 51 mL/min — ABNORMAL LOW (ref >60)
GFR calc non Af Amer: 44 mL/min — ABNORMAL LOW (ref >60)
Glucose, Bld: 114 mg/dL — ABNORMAL HIGH (ref 70–99)
Potassium: 3.7 mmol/L (ref 3.5–5.1)
Sodium: 140 mmol/L (ref 135–145)

## 2018-12-27 LAB — CBC
HCT: 28.6 % — ABNORMAL LOW (ref 36.0–46.0)
Hemoglobin: 9.1 g/dL — ABNORMAL LOW (ref 12.0–15.0)
MCH: 31.4 pg (ref 26.0–34.0)
MCHC: 31.8 g/dL (ref 30.0–36.0)
MCV: 98.6 fL (ref 80.0–100.0)
Platelets: 244 10*3/uL (ref 150–400)
RBC: 2.9 MIL/uL — ABNORMAL LOW (ref 3.87–5.11)
RDW: 13.4 % (ref 11.5–15.5)
WBC: 9.8 10*3/uL (ref 4.0–10.5)
nRBC: 0 % (ref 0.0–0.2)

## 2018-12-27 LAB — URINALYSIS, ROUTINE W REFLEX MICROSCOPIC
Bilirubin Urine: NEGATIVE
Glucose, UA: NEGATIVE mg/dL
Hgb urine dipstick: NEGATIVE
Ketones, ur: NEGATIVE mg/dL
Nitrite: POSITIVE — AB
Protein, ur: NEGATIVE mg/dL
Specific Gravity, Urine: 1.018 (ref 1.005–1.030)
WBC, UA: >50 WBC/hpf — ABNORMAL HIGH (ref 0–5)
pH: 7 (ref 5.0–8.0)

## 2018-12-27 LAB — HIV ANTIBODY (ROUTINE TESTING W REFLEX): HIV Screen 4th Generation wRfx: NONREACTIVE

## 2018-12-27 LAB — ABO/RH: ABO/RH(D): A POS

## 2018-12-27 SURGERY — ESOPHAGOGASTRODUODENOSCOPY (EGD) WITH PROPOFOL
Anesthesia: Monitor Anesthesia Care

## 2018-12-27 MED ORDER — ONDANSETRON HCL 4 MG PO TABS: 4.0000 mg | ORAL_TABLET | Freq: Four times a day (QID) | ORAL | Status: DC | PRN

## 2018-12-27 MED ORDER — SODIUM CHLORIDE 0.9% FLUSH
3.0000 mL | Freq: Two times a day (BID) | INTRAVENOUS | Status: DC
  Administered 2018-12-28: 3 mL via INTRAVENOUS

## 2018-12-27 MED ORDER — LITHIUM CARBONATE 300 MG PO CAPS
300.0000 mg | ORAL_CAPSULE | Freq: Two times a day (BID) | ORAL | Status: DC
  Administered 2018-12-27 – 2018-12-29 (×5): 300 mg via ORAL
  Filled 2018-12-27 (×6): qty 1

## 2018-12-27 MED ORDER — ATORVASTATIN CALCIUM 10 MG PO TABS
20.0000 mg | ORAL_TABLET | Freq: Every day | ORAL | Status: DC
  Administered 2018-12-27 – 2018-12-29 (×3): 20 mg via ORAL
  Filled 2018-12-27 (×3): qty 2

## 2018-12-27 MED ORDER — SODIUM CHLORIDE 0.9 % IV SOLN
INTRAVENOUS | Status: DC
  Administered 2018-12-27 – 2018-12-28 (×3): via INTRAVENOUS

## 2018-12-27 MED ORDER — METOPROLOL SUCCINATE ER 25 MG PO TB24
12.5000 mg | ORAL_TABLET | Freq: Two times a day (BID) | ORAL | Status: DC
  Administered 2018-12-27 – 2018-12-29 (×4): 12.5 mg via ORAL
  Filled 2018-12-27 (×5): qty 1

## 2018-12-27 MED ORDER — HYDROXYZINE HCL 25 MG PO TABS: 25.0000 mg | ORAL_TABLET | Freq: Three times a day (TID) | ORAL | Status: DC | PRN

## 2018-12-27 MED ORDER — FERROUS SULFATE 325 (65 FE) MG PO TABS
325.0000 mg | ORAL_TABLET | Freq: Every day | ORAL | Status: DC
  Administered 2018-12-28 – 2018-12-29 (×2): 325 mg via ORAL
  Filled 2018-12-27 (×2): qty 1

## 2018-12-27 MED ORDER — ACETAMINOPHEN 650 MG RE SUPP: 650.0000 mg | Freq: Four times a day (QID) | RECTAL | Status: DC | PRN

## 2018-12-27 MED ORDER — SODIUM CHLORIDE 0.9 % IV SOLN: INTRAVENOUS | Status: DC

## 2018-12-27 MED ORDER — ALBUTEROL SULFATE (2.5 MG/3ML) 0.083% IN NEBU: 2.5000 mg | INHALATION_SOLUTION | Freq: Four times a day (QID) | RESPIRATORY_TRACT | Status: DC | PRN

## 2018-12-27 MED ORDER — PANTOPRAZOLE SODIUM 40 MG IV SOLR
40.0000 mg | INTRAVENOUS | Status: AC
  Administered 2018-12-27: 40 mg via INTRAVENOUS
  Filled 2018-12-27: qty 40

## 2018-12-27 MED ORDER — ACETAMINOPHEN 325 MG PO TABS
650.0000 mg | ORAL_TABLET | Freq: Four times a day (QID) | ORAL | Status: DC | PRN
  Administered 2018-12-29: 650 mg via ORAL
  Filled 2018-12-27: qty 2

## 2018-12-27 MED ORDER — LIDOCAINE 2% (20 MG/ML) 5 ML SYRINGE
INTRAMUSCULAR | Status: DC | PRN
  Administered 2018-12-27: 40 mg via INTRAVENOUS

## 2018-12-27 MED ORDER — SODIUM CHLORIDE 0.9 % IV SOLN
8.0000 mg/h | INTRAVENOUS | Status: DC
  Administered 2018-12-27 – 2018-12-28 (×3): 8 mg/h via INTRAVENOUS
  Filled 2018-12-27 (×4): qty 80

## 2018-12-27 MED ORDER — MELATONIN 3 MG PO TABS
9.0000 mg | ORAL_TABLET | Freq: Every evening | ORAL | Status: DC | PRN
  Filled 2018-12-27: qty 3

## 2018-12-27 MED ORDER — GABAPENTIN 600 MG PO TABS
600.0000 mg | ORAL_TABLET | Freq: Three times a day (TID) | ORAL | Status: DC
  Administered 2018-12-27 – 2018-12-29 (×8): 600 mg via ORAL
  Filled 2018-12-27 (×8): qty 1

## 2018-12-27 MED ORDER — HYDRALAZINE HCL 20 MG/ML IJ SOLN: 10.0000 mg | INTRAMUSCULAR | Status: DC | PRN

## 2018-12-27 MED ORDER — PROPOFOL 500 MG/50ML IV EMUL
INTRAVENOUS | Status: DC | PRN
  Administered 2018-12-27: 125 ug/kg/min via INTRAVENOUS

## 2018-12-27 MED ORDER — ISOSORBIDE DINITRATE 10 MG PO TABS
30.0000 mg | ORAL_TABLET | Freq: Two times a day (BID) | ORAL | Status: DC
  Administered 2018-12-27 – 2018-12-29 (×6): 30 mg via ORAL
  Filled 2018-12-27 (×6): qty 3

## 2018-12-27 MED ORDER — ONDANSETRON HCL 4 MG/2ML IJ SOLN: 4.0000 mg | Freq: Four times a day (QID) | INTRAMUSCULAR | Status: DC | PRN

## 2018-12-27 MED ORDER — AMLODIPINE BESYLATE 10 MG PO TABS
10.0000 mg | ORAL_TABLET | Freq: Every day | ORAL | Status: DC
  Administered 2018-12-27 – 2018-12-29 (×3): 10 mg via ORAL
  Filled 2018-12-27 (×3): qty 1

## 2018-12-27 MED ORDER — SODIUM CHLORIDE 0.9 % IV SOLN
1.0000 g | INTRAVENOUS | Status: DC
  Administered 2018-12-27 – 2018-12-29 (×3): 1 g via INTRAVENOUS
  Filled 2018-12-27 (×3): qty 1

## 2018-12-27 MED ORDER — PANTOPRAZOLE SODIUM 40 MG IV SOLR: 40.0000 mg | Freq: Two times a day (BID) | INTRAVENOUS | Status: DC

## 2018-12-27 MED ORDER — PROPOFOL 10 MG/ML IV BOLUS
INTRAVENOUS | Status: DC | PRN
  Administered 2018-12-27: 30 mg via INTRAVENOUS
  Administered 2018-12-27: 15 mg via INTRAVENOUS
  Administered 2018-12-27: 30 mg via INTRAVENOUS

## 2018-12-27 SURGICAL SUPPLY — 14 items

## 2018-12-27 NOTE — Plan of Care (Signed)
  Problem: Education: Goal: Knowledge of General Education information will improve Description Including pain rating scale, medication(s)/side effects and non-pharmacologic comfort measures Outcome: Progressing   

## 2018-12-27 NOTE — Anesthesia Preprocedure Evaluation (Signed)
Anesthesia Evaluation  Patient identified by MRN, date of birth, ID band Patient awake    Reviewed: Allergy & Precautions, NPO status , Patient's Chart, lab work & pertinent test results  History of Anesthesia Complications Negative for: history of anesthetic complications  Airway Mallampati: III  TM Distance: >3 FB Neck ROM: Full    Dental  (+) Dental Advisory Given   Pulmonary neg pulmonary ROS,    breath sounds clear to auscultation       Cardiovascular hypertension, Pt. on medications and Pt. on home beta blockers  Rhythm:Regular     Neuro/Psych negative neurological ROS     GI/Hepatic Neg liver ROS, upper GI bleed, hemtemesis, melena   Endo/Other    Renal/GU CRFRenal disease     Musculoskeletal   Abdominal   Peds  Hematology  (+) anemia ,   Anesthesia Other Findings   Reproductive/Obstetrics                             Anesthesia Physical Anesthesia Plan  ASA: III  Anesthesia Plan: MAC   Post-op Pain Management:    Induction:   PONV Risk Score and Plan: 2 and Treatment may vary due to age or medical condition and Propofol infusion  Airway Management Planned: Nasal Cannula  Additional Equipment: None  Intra-op Plan:   Post-operative Plan:   Informed Consent: I have reviewed the patients History and Physical, chart, labs and discussed the procedure including the risks, benefits and alternatives for the proposed anesthesia with the patient or authorized representative who has indicated his/her understanding and acceptance.     Dental advisory given  Plan Discussed with: CRNA and Surgeon  Anesthesia Plan Comments:         Anesthesia Quick Evaluation

## 2018-12-27 NOTE — Anesthesia Postprocedure Evaluation (Signed)
Anesthesia Post Note  Patient: Dorothy Moran  Procedure(s) Performed: ESOPHAGOGASTRODUODENOSCOPY (EGD) WITH PROPOFOL (N/A ) BIOPSY     Patient location during evaluation: Endoscopy Anesthesia Type: MAC Level of consciousness: awake and alert Pain management: pain level controlled Vital Signs Assessment: post-procedure vital signs reviewed and stable Respiratory status: spontaneous breathing, nonlabored ventilation, respiratory function stable and patient connected to nasal cannula oxygen Cardiovascular status: stable and blood pressure returned to baseline Postop Assessment: no apparent nausea or vomiting Anesthetic complications: no    Last Vitals:  Vitals:   12/27/18 1313 12/27/18 1754  BP: (!) 148/67 (!) 92/58  Pulse:  81  Resp: 16 18  Temp:  37.6 C  SpO2: 100% 100%    Last Pain:  Vitals:   12/27/18 1754  TempSrc: Oral  PainSc:                  Carlita Whitcomb

## 2018-12-27 NOTE — Op Note (Addendum)
[facility] Patient Name: Dorothy Moran Procedure Date : 12/27/2018 MRN: [id number] Attending MD: Gatha Mayer , MD Date of Birth: Jul 22, 1955 CSN: [id number] Age: 63 Admit Type: Inpatient Procedure:                Upper GI endoscopy Indications:              Hematemesis, Melena Providers:                Gatha Mayer, MD, Carlyn Reichert, RN, Laverda Sorenson, Technician, Lavona Mound, CRNA Referring MD:              Medicines:                Propofol per Anesthesia, Monitored Anesthesia Care Complications:            No immediate complications. Estimated Blood Loss:     Estimated blood loss was minimal. Procedure:                Pre-Anesthesia Assessment:                           - Prior to the procedure, a History and Physical                            was performed, and patient medications and                            allergies were reviewed. The patient's tolerance of                            previous anesthesia was also reviewed. The risks                            and benefits of the procedure and the sedation                            options and risks were discussed with the patient.                            All questions were answered, and informed consent                            was obtained. Prior Anticoagulants: The patient has                            taken no previous anticoagulant or antiplatelet                            agents. ASA Grade Assessment: III - A patient with                            severe systemic disease. After reviewing the risks  and benefits, the patient was deemed in                            satisfactory condition to undergo the procedure.                           After obtaining informed consent, the endoscope was                            passed under direct vision. Throughout the                            procedure, the patient's blood pressure, pulse, and                        oxygen saturations were monitored continuously. The                            GIF-H190 ([id number] Olympus gastroscope was                            introduced through the mouth, and advanced to the                            second part of duodenum. The upper GI endoscopy was                            accomplished without difficulty. The patient                            tolerated the procedure well. Scope In: Scope Out: Findings:      One non-bleeding cratered duodenal ulcer with a clean ulcer base       (Forrest Class III) was found in the duodenal bulb.      Patchy moderate inflammation characterized by congestion (edema),       erosions and erythema was found in the prepyloric region of the stomach.       Biopsies were taken with a cold forceps for Helicobacter pylori testing       using CLOtest. Verification of patient identification for the specimen       was done. Estimated blood loss was minimal.      The cardia and gastric fundus were normal on retroflexion.      A non-obstructing Schatzki ring was found at the gastroesophageal       junction.      The exam was otherwise without abnormality. Impression:               - Non-bleeding duodenal ulcer with a clean ulcer                            base (Forrest Class III). There was sig edema and                            difficult to see ulcer base but was able to catch a  few glimpses of it.                           - Acute gastritis. Biopsied.                           - Non-obstructing Schatzki ring.                           - The examination was otherwise normal. Recommendation:           - Patient has a contact number available for                            emergencies. The signs and symptoms of potential                            delayed complications were discussed with the                            patient. Return to normal activities tomorrow.                             Written discharge instructions were provided to the                            patient.                           - Advance diet as tolerated.                           - Continue present medications.                           - When tolerating po well change to oral PPI - qd                            is fine                           Await CLO test                           Home 1-2 days depending upon clinical course Procedure Code(s):        --- Professional ---                           [phone], Esophagogastroduodenoscopy, flexible,                            transoral; with biopsy, single or multiple Diagnosis Code(s):        --- Professional ---                           K26.9, Duodenal ulcer, unspecified as acute or  chronic, without hemorrhage or perforation                           K29.00, Acute gastritis without bleeding                           K92.0, Hematemesis                           K92.1, Melena (includes Hematochezia) CPT copyright 2019 American Medical Association. All rights reserved. The codes documented in this report are preliminary and upon coder review may  be revised to meet current compliance requirements. Dorothy Booparl E Calen Posch, MD 12/27/2018 1:09:20 PM This report has been signed electronically. Number of Addenda: 0

## 2018-12-27 NOTE — Progress Notes (Signed)
New Admission Note: ? Arrival Method: Stretcher Mental Orientation: Alert and Oriented X4 Telemetry: Awaiting for Physician's orders Assessment: Completed Skin: Refer to flowsheet IV: Right Antecubital  Pain: 3 Tubes: None Safety Measures: Safety Fall Prevention Plan discussed with patient. Admission: Completed [facility] [place] Orientation: Patient has been orientated to the room, unit and the staff. Family: None at bedside Orders have been reviewed and are being implemented. Will continue to monitor the patient. Call light has been placed within reach and bed alarm has been activated.  ? Milagros Loll), RN  Phone Number: [phone]

## 2018-12-27 NOTE — H&P (Addendum)
History and Physical    Dorothy Moran ZOX:[id number] DOB: [birth year] DOA: 12/27/2018  Referring MD/NP/PA: Odie Seraimothy Opyd, MD PCP: Melvyn NethFrank Dolan, MD  Patient coming from: [facility] transfer  Chief Complaint: Dark stools and vomiting blood I have personally briefly reviewed patient's old medical records in [facility]   HPI: Dorothy Moran is a 63 y.o. female with medical history significant of hypertension, hyperlipidemia, peptic ulcer disease, bipolar disorder, and schizophrenia; who initially presented to [facility] yesterday with complaints of having dark stools and vomiting blood.  Symptoms started 3 days ago.  She reports vomiting dark blood multiple times with complaints of dark stools.  Patient had been on aspirin and Voltaren until recently being told to stop by her PCP after blood work revealed that her BUN and creatinine were elevated. Denies any history of alcohol use or liver disease.   Vital signs were noted to be stable.  Labs revealed WBC 12.32, hemoglobin 10.4 , BUN 87, creatinine 1.74, AST 41, ALT 85, total bilirubin 0.5, PT 12.3, and INR 1.1.  Urinalysis was positive for large leukocytes, greater than 50 WBCs, greater than 50 RBCs, and 2+ bacteria.  Per [facility] records in September of 2019 her hemoglobin had been around 12, BUN 24, and creatinine 1.17.  Patient was treated with Protonix 40 mg IV, and normal saline IV fluids.  COVID-19 screening negative.  Transfer requested due to lack of GI coverage on the weekends.  Dr. Leone PayorGessner of [facility] accepted to see the patient in consultation.  Patient was accepted to a telemetry bed [facility] at [facility].  Patient reports last vomiting yesterday afternoon at around 5 PM.  She complains of some mid epigastric abdominal soreness from vomiting.  She reports a history of bleeding ulcers, but last about having problems from them back in 1994.  She reports being in need of a colonoscopy.   ED Course: As seen above  Review of Systems   Constitutional: Positive for malaise/fatigue. Negative for chills, fever and weight loss.  HENT: Negative for congestion and sinus pain.   Eyes: Negative for double vision and photophobia.  Respiratory: Negative for cough and shortness of breath.   Cardiovascular: Negative for chest pain and leg swelling.  Gastrointestinal: Positive for abdominal pain, melena, nausea and vomiting.  Genitourinary: Negative for dysuria and hematuria.  Musculoskeletal: Negative for falls.  Neurological: Negative for focal weakness and loss of consciousness.  Psychiatric/Behavioral: Negative for substance abuse. The patient has insomnia.     Past Medical History:  Diagnosis Date  . Bipolar 1 disorder (HCC)   . HLD (hyperlipidemia)   . Hypertension   . Schizophrenia (HCC)      reports that she has never smoked. She has never used smokeless tobacco. She reports that she does not drink alcohol or use drugs.  Allergies  Allergen Reactions  . Ibuprofen Other (See Comments)    PT has HX of bleeding ulcers and  Can not take ibuprofen  . Aspirin Other (See Comments)    PT has a HX of bleeding ulcers    Family History  Problem Relation Age of Onset  . Hypertension Mother   . Seizures Father   . Mental illness Father     Prior to Admission medications   Medication Sig Start Date End Date Taking? Authorizing Provider  amLODipine (NORVASC) 5 MG tablet Take 5 mg by mouth daily.    [provider]  aspirin 81 MG chewable tablet Chew 81 mg by mouth daily.  [provider]  divalproex (DEPAKOTE ER) 500 MG 24 hr tablet Take 500 mg by mouth daily.    [provider]  esomeprazole (NEXIUM) 40 MG capsule Take 40 mg by mouth daily before breakfast.    [provider]  isosorbide dinitrate (ISORDIL) 30 MG tablet Take 30 mg by mouth 2 (two) times daily.    [provider]  Multiple Vitamin (MULITIVITAMIN WITH MINERALS) TABS Take 1 tablet by mouth daily.    [provider]  QUEtiapine (SEROQUEL) 100 MG tablet Take 100 mg by mouth at bedtime.    [provider]  rosuvastatin (CRESTOR) 20 MG tablet Take 20 mg by mouth daily.    [provider]  traMADol (ULTRAM) 50 MG tablet Take 50 mg by mouth every 6 (six) hours as needed. For pain    [provider]  traZODone (DESYREL) 50 MG tablet Take 50 mg by mouth at bedtime.    [provider]  venlafaxine (EFFEXOR-XR) 150 MG 24 hr capsule Take 150 mg by mouth daily.    [provider]    Physical Exam:  Constitutional: Older female in some mild discomfort when moving around hospital bed Vitals:   12/27/18 0626  BP: 130/70  Pulse: 68  Resp: 18  Temp: 98.8 F (37.1 C)  TempSrc: Oral  SpO2: 100%   Eyes: PERRL, lids and conjunctivae normal ENMT: Mucous membranes are dry.  Posterior pharynx clear of any exudate or lesions. Neck: normal, supple, no masses, no thyromegaly Respiratory: clear to auscultation bilaterally, no wheezing, no crackles. Normal respiratory effort. No accessory muscle use.  Cardiovascular: Regular rate and rhythm, no murmurs / rubs / gallops. No extremity edema. 2+ pedal pulses. No carotid bruits.  Abdomen: Mild epigastric tenderness, no masses palpated. No hepatosplenomegaly. Bowel sounds positive.  Musculoskeletal: no clubbing / cyanosis. No joint deformity upper and lower extremities. Good ROM, no contractures. Normal muscle tone.  Skin: no rashes, lesions, ulcers. No induration Neurologic: CN 2-12 grossly intact. Sensation intact, DTR normal. Strength 5/5 in all 4.  Psychiatric: Normal judgment and insight. Alert and oriented x 3. Normal mood.     Labs on Admission: I have personally reviewed following labs and imaging studies  CBC: Recent Labs  Lab 12/27/18 0738  WBC 9.8  HGB 9.1*  HCT 28.6*  MCV 98.6  PLT 063   Basic Metabolic Panel: Recent Labs  Lab 12/27/18 0738  NA 140  K 3.7  CL 108  CO2 23  GLUCOSE 114*   BUN 61*  CREATININE 1.27*  CALCIUM 8.8*   GFR: CrCl cannot be calculated (Unknown ideal weight.). Liver Function Tests: No results for input(s): AST, ALT, ALKPHOS, BILITOT, PROT, ALBUMIN in the last 168 hours. No results for input(s): LIPASE, AMYLASE in the last 168 hours. No results for input(s): AMMONIA in the last 168 hours. Coagulation Profile: No results for input(s): INR, PROTIME in the last 168 hours. Cardiac Enzymes: No results for input(s): CKTOTAL, CKMB, CKMBINDEX, TROPONINI in the last 168 hours. BNP (last 3 results) No results for input(s): PROBNP in the last 8760 hours. HbA1C: No results for input(s): HGBA1C in the last 72 hours. CBG: No results for input(s): GLUCAP in the last 168 hours. Lipid Profile: No results for input(s): CHOL, HDL, LDLCALC, TRIG, CHOLHDL, LDLDIRECT in the last 72 hours. Thyroid Function Tests: No results for input(s): TSH, T4TOTAL, FREET4, T3FREE, THYROIDAB in the last 72 hours. Anemia Panel: No results for input(s): VITAMINB12, FOLATE, FERRITIN, TIBC, IRON, RETICCTPCT in  the last 72 hours. Urine analysis: No results found for: COLORURINE, APPEARANCEUR, LABSPEC, PHURINE, GLUCOSEU, HGBUR, BILIRUBINUR, KETONESUR, PROTEINUR, UROBILINOGEN, NITRITE, LEUKOCYTESUR Sepsis Labs: No results found for this or any previous visit (from the past 240 hour(s)).   Radiological Exams on Admission: No results found.  [facility] records: Independently reviewed.    Assessment/Plan Upper GI bleed, acute blood loss anemia: Acute.  Patient presents with complaints of hematemesis and dark stools.  She admitted to NSAID use prior to PCP telling her to stop and previous history of  ulcers.  Hemoglobin 10.4 on 7/17 and previously noted to be 12 in September 2019.  Patient had been given 40 mg of Protonix IV once, and 1 L normal saline IV fluids at the outside hospital.  Transferred for need of GI consultative services.  Repeat lab work here in the hospital reveals  hemoglobin down to 9.1. -Admit to a telemetry bed -N.p.o. -Type and screen for possible need of blood products  -Protonix drip -Hold aspirin and Voltaren -Continue ferrous sulfate -Serial monitoring of hemoglobin, and we will transfuse blood products as needed -Antiemetics as needed -Appreciate [place] gastro[facility]ology, we will follow-up for any further recommendations  Acute kidney injury: Resolving.  Patient presented to the outside facility with a creatinine of 1.74 with BUN 87.  Baseline creatinine previously noted to be around 1.17 in September 2019.  Repeat lab work here in the hospital today revealed BUN 61, creatinine 1.27 -Normal saline IV fluids at 75 mL/h -Continue to monitor kidney function  Suspected urinary tract infection: Acute.  Patient noted to have urinalysis with positive bacteria, large leukocytes, greater than 50 WBCs, and greater than 50 RBCs. -Check urinalysis and urine culture -Rocephin IV  Leukocytosis: Resolved.  WBC initially noted to be 12.32 at the outside facility is now within normal limits on admission.  Suspect this could have been reactive in nature. -Continue to monitor  Elevated liver enzymes: Acute.  Outside facility records note AST 41, and ALT 85.  Patient denies any reports of alcohol use.  Suspect could be related with medications and/or blood loss. -Follow-up hepatic function study  Essential hypertension: Patient's blood pressures on admission within normal limits. -Hydralazine IV as needed for elevated blood pressure -Hold Benicar due to AKI -Restart amlodipine and metoprolol  Mood disorder (Bipolar disorder/schizophrenia) -Restart lithium and hydroxyzine as needed for anxiety  Hyperlipidemia: Patient previously on medications of Crestor.  History of peptic ulcer disease, GERD: Home medications included Nexium. -Treatment as seen above   DVT prophylaxis: SCDs Code Status: Full Family Communication: Updated the patient's friend  Archie Pattenonya as requested at [phone]  Disposition Plan: Possible discharge home in 2 to 3 days Consults called: GI Admission status: Inpatient  Clydie Braunondell A  MD [facility] Pager [phone]   If 7PM-7AM, please contact night-coverage [email or web address] Password [email or web address]  12/27/2018, 8:41 AM

## 2018-12-27 NOTE — Anesthesia Procedure Notes (Signed)
Procedure Name: MAC Date/Time: 12/27/2018 12:45 PM Performed by: Elayne Snare, CRNA Pre-anesthesia Checklist: Patient identified, Emergency Drugs available, Suction available and Patient being monitored Patient Re-evaluated:Patient Re-evaluated prior to induction Oxygen Delivery Method: Nasal cannula

## 2018-12-27 NOTE — Progress Notes (Signed)
Called placed to primary RN Janelle regarding COVID test prior to EGD in endoscopy 12/27/2018. Per Primary RN patient had negative COVID test at prior hospital before transfer. Paper copy is in physical chart on patient unit.

## 2018-12-27 NOTE — Consult Note (Addendum)
Referring Provider: Dr. Madelyn Flavorsondell Smith  Primary Care Physician:   Primary Gastroenterologist: unassigned   Reason for Consultation: Anemia, GI bleed  HPI: Dorothy Moran is a 63 y.o. female with a past medical history of HTN, Hyperlipidemia, COPD on 2L [place] at home, sleep apnea, UGI bleed secondary to PUD age 63, CKD stage 3, osteoarthritis, carpal tunnel syndrome, bipolar disorder and schizophrenia. She started vomiting dark red blood and passing loose black stools on Wed. 7/15 which persisted throughout the night. Thurs am she passed 1 loose black stool. No further hematemesis. However, she drank apple juice and vomited the juice contents. She developed mild epigastric pain, described as a soreness on Friday 7/17. No further melena. Occasional red blood from the rectum. She was recently advised by her PCP to schedule a colonoscopy. She takes Naproxen one tab daily for > 10 years. She also takes aspirin 81 mg once daily. No alcohol use. She does not take any PPI or acid reducing medications. She presented to  [facility] yesterday for further evaluation. Hg 10.4. Baseline Hg 10.4. AST 41, ALT 85, total bilirubin 0.5, PT 12.3, and INR 1.1. Covid 19 negative. Urinalysis was positive for large leukocytes, greater than 50 WBCs. She was started on Protonix IV and transferred to [facility] due to lack of GI coverage at [facility] on the weekends.    ED course: Sodium 140 potassium 3.7.  Glucose 114.  BUN 61.  Creatinine 1.27.  Anion gap 9.  WBC 9.8.  Hemoglobin 9.1.  Hematocrit 28.6.  MCV 8098.6.  Platelet 244. UA > 50WBCs. Hepatic panel ordered for today.   [facility]  EGD/colonoscopy done ate 24, patient reports diagnosed with bleeding stomach ulcers.  Patient's outpatient medication list includes Isordil. She denies having any hx of CAD. She reports having "small" heart vessels. She takes ASA 81mg  daily.  Past Medical History:  Diagnosis Date  . Bipolar 1  disorder (HCC)   . HLD (hyperlipidemia)   . Hypertension   . Schizophrenia (HCC)       Prior to Admission medications   Medication Sig Start Date End Date Taking? Authorizing Provider  amLODipine (NORVASC) 5 MG tablet Take 5 mg by mouth daily.    [provider]  aspirin 81 MG chewable tablet Chew 81 mg by mouth daily.    [provider]  divalproex (DEPAKOTE ER) 500 MG 24 hr tablet Take 500 mg by mouth daily.    [provider]  esomeprazole (NEXIUM) 40 MG capsule Take 40 mg by mouth daily before breakfast.    [provider]  isosorbide dinitrate (ISORDIL) 30 MG tablet Take 30 mg by mouth 2 (two) times daily.    [provider]  Multiple Vitamin (MULITIVITAMIN WITH MINERALS) TABS Take 1 tablet by mouth daily.    [provider]  QUEtiapine (SEROQUEL) 100 MG tablet Take 100 mg by mouth at bedtime.    [provider]  rosuvastatin (CRESTOR) 20 MG tablet Take 20 mg by mouth daily.    [provider]  traMADol (ULTRAM) 50 MG tablet Take 50 mg by mouth every 6 (six) hours as needed. For pain    [provider]  traZODone (DESYREL) 50 MG tablet Take 50 mg by mouth at bedtime.    [provider]  venlafaxine (EFFEXOR-XR) 150 MG 24 hr capsule Take 150 mg by mouth daily.    [provider]    Current Facility-Administered Medications  Medication Dose Route  Frequency Provider Last Rate Last Dose  . 0.9 %  sodium chloride infusion   Intravenous Continuous Madelyn FlavorsSmith, Rondell A, MD 75 mL/hr at 12/27/18 0933    . 0.9 %  sodium chloride infusion   Intravenous Continuous Arnaldo NatalKennedy-Smith, Colleen M, NP      . acetaminophen (TYLENOL) tablet 650 mg  650 mg Oral Q6H PRN Clydie BraunSmith, Rondell A, MD       Or  . acetaminophen (TYLENOL) suppository 650 mg  650 mg Rectal Q6H PRN Smith, Rondell A, MD      . albuterol (PROVENTIL) (2.5 MG/3ML) 0.083% nebulizer solution 2.5 mg  2.5 mg Nebulization Q6H PRN Smith, Rondell A, MD       . amLODipine (NORVASC) tablet 10 mg  10 mg Oral Daily Smith, Rondell A, MD      . atorvastatin (LIPITOR) tablet 20 mg  20 mg Oral QHS Smith, Rondell A, MD      . cefTRIAXone (ROCEPHIN) 1 g in sodium chloride 0.9 % 100 mL IVPB  1 g Intravenous Q24H Smith, Rondell A, MD 200 mL/hr at 12/27/18 1113 1 g at 12/27/18 1113  . [START ON 12/28/2018] ferrous sulfate tablet 325 mg  325 mg Oral Q breakfast Smith, Rondell A, MD      . gabapentin (NEURONTIN) tablet 600 mg  600 mg Oral TID Madelyn FlavorsSmith, Rondell A, MD      . hydrALAZINE (APRESOLINE) injection 10 mg  10 mg Intravenous Q4H PRN Katrinka BlazingSmith, Rondell A, MD      . hydrOXYzine (ATARAX/VISTARIL) tablet 25 mg  25 mg Oral TID PRN Madelyn FlavorsSmith, Rondell A, MD      . isosorbide dinitrate (ISORDIL) tablet 30 mg  30 mg Oral BID Smith, Rondell A, MD      . lithium carbonate capsule 300 mg  300 mg Oral BID WC Smith, Rondell A, MD      . Melatonin TABS 9 mg  9 mg Oral QHS PRN Katrinka BlazingSmith, Rondell A, MD      . metoprolol succinate (TOPROL-XL) 24 hr tablet 12.5 mg  12.5 mg Oral BID Smith, Rondell A, MD      . ondansetron (ZOFRAN) tablet 4 mg  4 mg Oral Q6H PRN Madelyn FlavorsSmith, Rondell A, MD       Or  . ondansetron (ZOFRAN) injection 4 mg  4 mg Intravenous Q6H PRN Smith, Rondell A, MD      . pantoprazole (PROTONIX) 80 mg in sodium chloride 0.9 % 250 mL (0.32 mg/mL) infusion  8 mg/hr Intravenous Continuous Madelyn FlavorsSmith, Rondell A, MD 25 mL/hr at 12/27/18 0934 8 mg/hr at 12/27/18 0934  . [START ON 12/30/2018] pantoprazole (PROTONIX) injection 40 mg  40 mg Intravenous Q12H Smith, Rondell A, MD      . sodium chloride flush (NS) 0.9 % injection 3 mL  3 mL Intravenous Q12H Katrinka BlazingSmith, Rondell A, MD        Allergies as of 12/26/2018 - Review Complete 08/26/2011  Allergen Reaction Noted  . Ibuprofen Other (See Comments) 08/26/2011  . Aspirin Other (See Comments) 08/26/2011    Family History  Problem Relation Age of Onset  . Hypertension Mother   . Seizures Father   . Mental illness Father     Social History    Social History Narrative   From [place], [place]   Married - husband in SNF - Parkinson's, Strokes   No EtOH, tobacco, drugs     Review of Systems:SeeHPI, all other systems reviewed and are negative  Physical Exam: Vital signs in last  24 hours: Temp:  [98.7 F (37.1 C)-99 F (37.2 C)] 98.7 F (37.1 C) (07/18 1204) Pulse Rate:  [62-68] 62 (07/18 1204) Resp:  [16-20] 20 (07/18 1204) BP: (94-130)/(59-70) 121/59 (07/18 1204) SpO2:  [99 %-100 %] 100 % (07/18 1204) Weight:  [59.9 kg] 59.9 kg (07/18 1204)   General:   Alert,  well-developed, well-nourished, pleasant and cooperative in NAD. Head:  Normocephalic and atraumatic. Eyes:  Sclera clear, no icterus. Conjunctiva pink. Ears:  Normal auditory acuity. Nose:  No deformity, discharge or lesions. Mouth:  No deformity or lesions.   Neck:  Supple. Lungs:  Clear throughout, diminished in the bases. Heart: RRR, no murmur.  Abdomen:  Soft, nontender, nondistended, + BS x 4 quads, no HSM. Rectal: Scant amount of melenic watery stool in rectal vault.  Msk:  Symmetrical without gross deformities. . Pulses:  Normal pulses noted. Extremities:  Without clubbing or edema. Neurologic:  Alert and  oriented x4;  grossly normal neurologically. Skin:  Intact without significant lesions or rashes.. Psych:  Alert and cooperative. Normal mood and affect.   Lab Results: Recent Labs    12/27/18 0738  WBC 9.8  HGB 9.1*  HCT 28.6*  PLT 244   BMET Recent Labs    12/27/18 0738  NA 140  K 3.7  CL 108  CO2 23  GLUCOSE 114*  BUN 61*  CREATININE 1.27*  CALCIUM 8.8*   SARS-2 CORONAVIRUS TEST NEGATIVE AT [facility], [place]  IMPRESSION/PLAN:  1. 63 y.o. female with UGI bleed, hematemesis, melena with associated anemia. Hg 9.8. HCT 28.6. BUN 61. HX of chronic NSAID use. HX of bleeding ulcers age 38. She is hemodynamically stable.  -NPO for EGD today, EGD benefits and risks discussed with patient including risk of sedation, bleeding,  infection and perforation.  -Protonix 40mg  IV bid -Serial H/H  -No NSAIDS  2. Mildly  Elevated LFTs -await repeat hepatic panel   3. AKI on CKD. UTI on Rocephin. BUN 61. Cr. 1.27.  4. Rectal bleeding -will need eventual colonoscopy   5. HTN, cardiac hx unclear  Further recommendations p er Dr. Phylis Bougie, NP  12/27/2018, 12:31 PM     GI Attending   I have taken an interval history, reviewed the chart and examined the patient. I agree with the Advanced Practitioner's note, impression and recommendations.    Gatha Mayer, MD, [facility] 12/27/2018 12:32 PM Pager [phone]

## 2018-12-27 NOTE — Transfer of Care (Signed)
Immediate Anesthesia Transfer of Care Note  Patient: Dorothy Moran  Procedure(s) Performed: ESOPHAGOGASTRODUODENOSCOPY (EGD) WITH PROPOFOL (N/A )  Patient Location: Endoscopy Unit  Anesthesia Type:MAC  Level of Consciousness: drowsy and responds to stimulation  Airway & Oxygen Therapy: Patient Spontanous Breathing  Post-op Assessment: Report given to RN and Post -op Vital signs reviewed and stable  Post vital signs: Reviewed and stable  Last Vitals:  Vitals Value Taken Time  BP    Temp    Pulse    Resp    SpO2      Last Pain:  Vitals:   12/27/18 1204  TempSrc: Oral  PainSc: 0-No pain      Patients Stated Pain Goal: 0 (79/02/40 9735)  Complications: No apparent anesthesia complications

## 2018-12-27 NOTE — Progress Notes (Signed)
Patient has arrived on Unit and vital signs are stable. Admissions has been text paged, awaiting for Physician's orders. Will continue to monitor.   Rees Santistevan,RN.

## 2018-12-28 ENCOUNTER — Encounter (HOSPITAL_COMMUNITY): Payer: Self-pay | Admitting: Internal Medicine

## 2018-12-28 LAB — CBC
HCT: 21.1 % — ABNORMAL LOW (ref 36.0–46.0)
Hemoglobin: 6.8 g/dL — CL (ref 12.0–15.0)
MCH: 32.2 pg (ref 26.0–34.0)
MCHC: 32.2 g/dL (ref 30.0–36.0)
MCV: 100 fL (ref 80.0–100.0)
Platelets: 181 10*3/uL (ref 150–400)
RBC: 2.11 MIL/uL — ABNORMAL LOW (ref 3.87–5.11)
RDW: 13.2 % (ref 11.5–15.5)
WBC: 9.1 10*3/uL (ref 4.0–10.5)
nRBC: 0 % (ref 0.0–0.2)

## 2018-12-28 LAB — HEPATIC FUNCTION PANEL
ALT: 48 U/L — ABNORMAL HIGH (ref 0–44)
AST: 28 U/L (ref 15–41)
Albumin: 2.6 g/dL — ABNORMAL LOW (ref 3.5–5.0)
Alkaline Phosphatase: 50 U/L (ref 38–126)
Bilirubin, Direct: <0.1 mg/dL (ref 0.0–0.2)
Total Bilirubin: 0.7 mg/dL (ref 0.3–1.2)
Total Protein: 5.1 g/dL — ABNORMAL LOW (ref 6.5–8.1)

## 2018-12-28 LAB — BASIC METABOLIC PANEL
Anion gap: 5 (ref 5–15)
BUN: 26 mg/dL — ABNORMAL HIGH (ref 8–23)
CO2: 23 mmol/L (ref 22–32)
Calcium: 8.3 mg/dL — ABNORMAL LOW (ref 8.9–10.3)
Chloride: 112 mmol/L — ABNORMAL HIGH (ref 98–111)
Creatinine, Ser: 0.97 mg/dL (ref 0.44–1.00)
GFR calc Af Amer: >60 mL/min (ref >60)
GFR calc non Af Amer: >60 mL/min (ref >60)
Glucose, Bld: 119 mg/dL — ABNORMAL HIGH (ref 70–99)
Potassium: 4 mmol/L (ref 3.5–5.1)
Sodium: 140 mmol/L (ref 135–145)

## 2018-12-28 LAB — CLOTEST (H. PYLORI), BIOPSY: Helicobacter screen: NEGATIVE — AB

## 2018-12-28 LAB — PREPARE RBC (CROSSMATCH)

## 2018-12-28 LAB — HEMOGLOBIN: Hemoglobin: 10.4 g/dL — ABNORMAL LOW (ref 12.0–15.0)

## 2018-12-28 MED ORDER — SODIUM CHLORIDE 0.9% IV SOLUTION: Freq: Once | INTRAVENOUS | Status: DC

## 2018-12-28 NOTE — Progress Notes (Signed)
Blount, NP, paged with Hgb result of 6.8. Orders received.

## 2018-12-28 NOTE — Progress Notes (Addendum)
     [facility] Progress Note  CC:  Anemia, UGI bleed   Subjective: She denies having any abdominal pain of vomiting. No BM. Tolerating a clear liquid diet.   Objective:  Vital signs in last 24 hours: Temp:  [98.3 F (36.8 C)-99.6 F (37.6 C)] 99.2 F (37.3 C) (07/19 1114) Pulse Rate:  [52-81] 71 (07/19 1114) Resp:  [16-28] 18 (07/19 1114) BP: (92-148)/(54-89) 113/61 (07/19 1114) SpO2:  [97 %-100 %] 97 % (07/19 1106) Weight:  [57.6 kg] 57.6 kg (07/19 0544) Last BM Date: 12/26/18 General:   Alert,  Well-developed,    in NAD Heart: RRR, no murmurs. Pulm: Clear throughout.  Abdomen: Protuberant but soft, nontender, + BS x 4 quads. Extremities:  Without edema. Neurologic:  Alert and  oriented x4;  grossly normal neurologically. Psych:  Alert and cooperative. Normal mood and affect.  Intake/Output from previous day: 07/18 0701 - 07/19 0700 In: 1687.7 [P.O.:360; I.V.:1097.7; Blood:30; IV Piggyback:200] Out: 1150 [Urine:1150] Intake/Output this shift: Total I/O In: 1094.6 [P.O.:300; I.V.:479.6; Blood:315] Out: 400 [Urine:400]  Lab Results: Recent Labs    12/27/18 0738 12/28/18 0246  WBC 9.8 9.1  HGB 9.1* 6.8*  HCT 28.6* 21.1*  PLT 244 181   BMET Recent Labs    12/27/18 0738 12/28/18 0246  NA 140 140  K 3.7 4.0  CL 108 112*  CO2 23 23  GLUCOSE 114* 119*  BUN 61* 26*  CREATININE 1.27* 0.97  CALCIUM 8.8* 8.3*   LFT Recent Labs    12/28/18 0246  PROT 5.1*  ALBUMIN 2.6*  AST 28  ALT 48*  ALKPHOS 50  BILITOT 0.7  BILIDIR <0.1  IBILI NOT CALCULATED    Assessment / Plan:  1. Upper GIB, melena, hematemesis with anemia. S/P EGD 7/18 which identified a non-bleeding duodenal ulcer with a clean ulcer base. Today Hg 6.8 down from 9.1.  BUN 26 down from 61. Two units of PRBCs ordered, 2nd transfusion in process. No further hematemesis or melena. No BM today. No abdominal pain.  -change Protonix 40mg  IV bid -advance to full diet -await post  transfusion H/H  2. Rectal bleeding, no current rectal bleeding -will need colonoscopy as outpatient   3. HX CKD. AKI resolved. Cr 0.97.  4. Mildly elevated LFTs improving. ALT 48 down from 70   Further recommendations per Dr. Carlean Purl   LOS: 1 day   [place]  12/28/2018, 1:01 PM    [facility] Attending   I have taken an interval history, reviewed the chart and examined the patient. I agree with the Advanced Practitioner's note, impression and recommendations.     She got 2 U early AM but no f/u Hgb so I will check Lack of stools good sign Start soft diet CLO test neg = no H pylori   Gatha Mayer, MD, [facility] 12/28/2018 2:08 PM Pager [phone]   CBC Latest Ref Rng & Units 12/28/2018 12/28/2018 12/27/2018  WBC 4.0 - 10.5 K/uL - 9.1 9.8  Hemoglobin 12.0 - 15.0 g/dL 10.4(L) 6.8(LL) 9.1(L)  Hematocrit 36.0 - 46.0 % - 21.1(L) 28.6(L)  Platelets 150 - 400 K/uL - 181 244   Hgb up  Feed  Could dc tomorrow if ok  Home on daily PPI   Get local [place] f/u through her PCP    Signing off  Thanks  Gatha Mayer, MD, [facility] 12/28/2018 6:32 PM Pager [phone]

## 2018-12-28 NOTE — Plan of Care (Signed)
  Problem: Education: Goal: Knowledge of General Education information will improve Description: Including pain rating scale, medication(s)/side effects and non-pharmacologic comfort measures 12/28/2018 1136 by Dolores Hoose, RN Outcome: Progressing

## 2018-12-28 NOTE — Progress Notes (Signed)
I asked the patient about her home medications that she has in her bag at bedside. Patient became agitated and said, "I don't want you to take my medications and do anything with them because I am going home tomorrow and I don't want any delays. My medicines have been at the bottom of my bag since I've been here and they can stay there. I haven't touched them and I'm not going to."

## 2018-12-28 NOTE — Progress Notes (Signed)
Patient ID: Dorothy Moran, female   DOB: 1955-06-23, 63 y.o.   MRN: [id number]  PROGRESS NOTE    Dorothy Moran  YQM:[id number] DOB: 1955-06-23 DOA: 12/27/2018 PCP: Default, Provider, MD   Brief Narrative:  63 year old female with history of hypertension, hyperlipidemia, peptic ulcer disease, bipolar disorder and schizophrenia was [facility] on 12/27/2018 with complaints of coffee-ground emesis and dark stools.  Hemoglobin was 10.4.  She was started on intravenous Protonix and IV fluids.  She was [facility].  She underwent upper GI endoscopy on 12/27/2018.  Assessment & Plan:  Upper GI bleeding most likely secondary to duodenal ulcer in a patient with peptic ulcer disease Acute blood loss anemia -Status post upper GI endoscopy on 12/27/2018 which showed nonbleeding duodenal ulcer with clean ulcer base along with acute gastritis.  Continue IV Protonix. -Hemoglobin 6.8 this morning.  No overnight black or bloody bowel movements or vomiting.  Transfuse packed red cells.  Monitor H&H. -GI following.  Diet advancement as per GI.  Acute kidney injury -Probably prerenal from above -Improving.  Decrease normal saline to 50 cc an hour.  Monitor  Probable UTI -Cultures negative so far.  Continue Rocephin  Leukocytosis -Resolved  Minimally elevated liver enzymes -Improving  Essential hypertension -Blood pressure on the lower side but stable.  Continue amlodipine and isosorbide dinitrate along with metoprolol.  Benicar on hold  Mood disorder: Bipolar disorder/schizophrenia -Continue lithium along with hydroxyzine as needed for anxiety  Hyperlipidemia -Continue atorvastatin  DVT prophylaxis: SCDs Code Status: Full Family Communication: Spoke to patient Disposition Plan: Home in 1 to 2 days if clinically stable and cleared by GI  Consultants: GI  Procedures:  EGD Impression:               - Non-bleeding duodenal ulcer with a  clean ulcer                            base (Forrest Class III). There was sig edema and                            difficult to see ulcer base but was able to catch a                            few glimpses of it.                           - Acute gastritis. Biopsied.                           - Non-obstructing Schatzki ring.                           - The examination was otherwise normal. Recommendation:           - Patient has a contact number available for                            emergencies. The signs and symptoms of potential                            delayed complications were discussed  with the                            patient. Return to normal activities tomorrow.                            Written discharge instructions were provided to the                            patient.                           - Advance diet as tolerated.                           - Continue present medications.                           - When tolerating po well change to oral PPI - qd                            is fine                           Await CLO test                           Home 1-2 days depending upon clinical course  Antimicrobials: Rocephin from 12/27/2018 onwards   Subjective: Patient seen and examined at bedside.  Denies any overnight black or bloody bowel movement.  No vomiting.  Denies abdominal pain.  No overnight fever.  Objective: Vitals:   12/28/18 0354 12/28/18 0544 12/28/18 0624 12/28/18 0921  BP: (!) 106/55 106/66 103/67 109/89  Pulse: (!) 54 (!) 56 (!) 52 60  Resp: 18 16 18 18   Temp: 99.3 F (37.4 C) 98.8 F (37.1 C) 99.3 F (37.4 C) 98.8 F (37.1 C)  TempSrc: Oral Oral Oral Oral  SpO2: 100% 98% 100% 99%  Weight:  57.6 kg    Height:        Intake/Output Summary (Last 24 hours) at 12/28/2018 1007 Last data filed at 12/28/2018 0900 Gross per 24 hour  Intake 1979.98 ml  Output 1550 ml  Net 429.98 ml   Filed Weights   12/27/18 1204 12/28/18 0544   Weight: 59.9 kg 57.6 kg    Examination:  General exam: Appears calm and comfortable  Respiratory system: Bilateral decreased breath sounds at bases Cardiovascular system: S1 & S2 heard, mildly bradycardic Gastrointestinal system: Abdomen is nondistended, soft and nontender. Normal bowel sounds heard. Extremities: No cyanosis, clubbing, edema  Central nervous system: Alert and oriented. No focal neurological deficits. Moving extremities Skin: No rashes, lesions or ulcers Psychiatry: Judgement and insight appear normal. Mood & affect appropriate.     Data Reviewed: I have personally reviewed following labs and imaging studies  CBC: Recent Labs  Lab 12/27/18 0738 12/28/18 0246  WBC 9.8 9.1  HGB 9.1* 6.8*  HCT 28.6* 21.1*  MCV 98.6 100.0  PLT 244 956   Basic Metabolic Panel: Recent Labs  Lab 12/27/18 0738 12/28/18 0246  NA 140 140  K 3.7 4.0  CL 108 112*  CO2 23 23  GLUCOSE 114*  119*  BUN 61* 26*  CREATININE 1.27* 0.97  CALCIUM 8.8* 8.3*   GFR: Estimated Creatinine Clearance: 49.9 mL/min (by C-G formula based on SCr of 0.97 mg/dL). Liver Function Tests: Recent Labs  Lab 12/27/18 0738 12/28/18 0246  AST 50* 28  ALT 70* 48*  ALKPHOS 63 50  BILITOT 0.7 0.7  PROT 6.3* 5.1*  ALBUMIN 3.4* 2.6*   No results for input(s): LIPASE, AMYLASE in the last 168 hours. No results for input(s): AMMONIA in the last 168 hours. Coagulation Profile: No results for input(s): INR, PROTIME in the last 168 hours. Cardiac Enzymes: No results for input(s): CKTOTAL, CKMB, CKMBINDEX, TROPONINI in the last 168 hours. BNP (last 3 results) No results for input(s): PROBNP in the last 8760 hours. HbA1C: No results for input(s): HGBA1C in the last 72 hours. CBG: No results for input(s): GLUCAP in the last 168 hours. Lipid Profile: No results for input(s): CHOL, HDL, LDLCALC, TRIG, CHOLHDL, LDLDIRECT in the last 72 hours. Thyroid Function Tests: No results for input(s): TSH, T4TOTAL,  FREET4, T3FREE, THYROIDAB in the last 72 hours. Anemia Panel: No results for input(s): VITAMINB12, FOLATE, FERRITIN, TIBC, IRON, RETICCTPCT in the last 72 hours. Sepsis Labs: No results for input(s): PROCALCITON, LATICACIDVEN in the last 168 hours.  No results found for this or any previous visit (from the past 240 hour(s)).       Radiology Studies: No results found.      Scheduled Meds: . sodium chloride   Intravenous Once  . amLODipine  10 mg Oral Daily  . atorvastatin  20 mg Oral QHS  . ferrous sulfate  325 mg Oral Q breakfast  . gabapentin  600 mg Oral TID  . isosorbide dinitrate  30 mg Oral BID  . lithium carbonate  300 mg Oral BID WC  . metoprolol succinate  12.5 mg Oral BID  . [START ON 12/30/2018] pantoprazole  40 mg Intravenous Q12H  . sodium chloride flush  3 mL Intravenous Q12H   Continuous Infusions: . sodium chloride 75 mL/hr at 12/28/18 0600  . cefTRIAXone (ROCEPHIN)  IV Stopped (12/27/18 1143)  . pantoprozole (PROTONIX) infusion 8 mg/hr (12/28/18 0818)     LOS: 1 day        Glade LloydKshitiz Ronney Honeywell, MD [facility] 12/28/2018, 10:07 AM

## 2018-12-29 LAB — CBC WITH DIFFERENTIAL/PLATELET
Abs Immature Granulocytes: 0.07 10*3/uL (ref 0.00–0.07)
Basophils Absolute: 0 10*3/uL (ref 0.0–0.1)
Basophils Relative: 1 %
Eosinophils Absolute: 0.2 10*3/uL (ref 0.0–0.5)
Eosinophils Relative: 3 %
HCT: 27.6 % — ABNORMAL LOW (ref 36.0–46.0)
Hemoglobin: 8.9 g/dL — ABNORMAL LOW (ref 12.0–15.0)
Immature Granulocytes: 1 %
Lymphocytes Relative: 23 %
Lymphs Abs: 1.4 10*3/uL (ref 0.7–4.0)
MCH: 30.9 pg (ref 26.0–34.0)
MCHC: 32.2 g/dL (ref 30.0–36.0)
MCV: 95.8 fL (ref 80.0–100.0)
Monocytes Absolute: 0.6 10*3/uL (ref 0.1–1.0)
Monocytes Relative: 10 %
Neutro Abs: 3.9 10*3/uL (ref 1.7–7.7)
Neutrophils Relative %: 62 %
Platelets: 160 10*3/uL (ref 150–400)
RBC: 2.88 MIL/uL — ABNORMAL LOW (ref 3.87–5.11)
RDW: 14.4 % (ref 11.5–15.5)
WBC: 6.2 10*3/uL (ref 4.0–10.5)
nRBC: 0 % (ref 0.0–0.2)

## 2018-12-29 LAB — BPAM RBC
Blood Product Expiration Date: 202007242359
Blood Product Expiration Date: 202007242359
Blood Product Expiration Date: 202008132359
ISSUE DATE / TIME: 202007190604
ISSUE DATE / TIME: 202007190959
ISSUE DATE / TIME: 202007191047
Unit Type and Rh: 6200
Unit Type and Rh: 6200
Unit Type and Rh: 6200

## 2018-12-29 LAB — BASIC METABOLIC PANEL
Anion gap: 6 (ref 5–15)
BUN: 20 mg/dL (ref 8–23)
CO2: 24 mmol/L (ref 22–32)
Calcium: 8.6 mg/dL — ABNORMAL LOW (ref 8.9–10.3)
Chloride: 111 mmol/L (ref 98–111)
Creatinine, Ser: 1.23 mg/dL — ABNORMAL HIGH (ref 0.44–1.00)
GFR calc Af Amer: 53 mL/min — ABNORMAL LOW (ref >60)
GFR calc non Af Amer: 46 mL/min — ABNORMAL LOW (ref >60)
Glucose, Bld: 106 mg/dL — ABNORMAL HIGH (ref 70–99)
Potassium: 4.2 mmol/L (ref 3.5–5.1)
Sodium: 141 mmol/L (ref 135–145)

## 2018-12-29 LAB — TYPE AND SCREEN
ABO/RH(D): A POS
Antibody Screen: NEGATIVE
Unit division: 0
Unit division: 0
Unit division: 0

## 2018-12-29 LAB — URINE CULTURE: Culture: NO GROWTH

## 2018-12-29 LAB — MAGNESIUM: Magnesium: 1.8 mg/dL (ref 1.7–2.4)

## 2018-12-29 MED ORDER — PANTOPRAZOLE SODIUM 40 MG PO TBEC: 40.0000 mg | DELAYED_RELEASE_TABLET | Freq: Every day | ORAL | 0 refills | Status: AC

## 2018-12-29 MED ORDER — ONDANSETRON HCL 4 MG PO TABS: 4.0000 mg | ORAL_TABLET | Freq: Four times a day (QID) | ORAL | 0 refills | Status: AC | PRN

## 2018-12-29 NOTE — Plan of Care (Signed)
  Problem: Education: Goal: Knowledge of General Education information will improve Description: Including pain rating scale, medication(s)/side effects and non-pharmacologic comfort measures 12/29/2018 1346 by Baldo Ash, RN Outcome: Adequate for Discharge 12/29/2018 0814 by Baldo Ash, RN Outcome: Progressing

## 2018-12-29 NOTE — TOC Transition Note (Addendum)
Transition of Care [facility]) - CM/SW Discharge Note   Patient Details  Name: Dorothy Moran MRN: [id number] Date of Birth: [birth year]  [facility] CM/SW Contact:  Sable Feil, LCSW Phone Number: 12/29/2018, 5:15 PM   Clinical Narrative:  Patient medically stable for transport home - [place], [place] and requested assistance with getting home. Patient is ambulatory and informed CSW that her daughter does not live in [place], her husband is in a nursing home and her mother is elderly and does not live in [place]. Ms. Seebeck has Medicaid and [place] transportation contacted through Social Services [phone]) and was informed by Joellen Jersey that patient is active QMD and has no transportation benefits. Consulted with TOC Surveyor, quantity, Nathaniel Man and was advised to [employer] transportation. [employer] [phone]) contacted and they are booked for today and cannot provide transportation and was not sure that they could transport patient tomorrow. Patient will be transported home by [facility].      Final next level of care: Home/Self Care Barriers to Discharge: Barriers Resolved(Transportation home)   Patient Goals and CMS Choice Patient states their goals for this hospitalization and ongoing recovery are:: Get better and return home CMS Medicare.gov Compare Post Acute Care list provided to:: Other (Comment Required)(n/a) Choice offered to / list presented to : NA  Discharge Placement - home              Patient chooses bed at: (Patient discharging home) Patient to be transferred to facility by: Patient will be trasnported home by non-emergency ambulance Name of family member notified: Patient notifed her mother by phone Patient and family notified of of transfer: 12/29/18  Discharge Plan and [place] - no home health services needed                                   Social Determinants of Health (SDOH) Interventions  No SDOH interventions  needed   Readmission Risk Interventions No flowsheet data found.

## 2018-12-29 NOTE — Progress Notes (Signed)
[facility] here to pick patient up.Patient transported home by [facility].

## 2018-12-29 NOTE — Discharge Summary (Signed)
Physician Discharge Summary  Dorothy Moran DGL:[id number] DOB: February 27, 1956 DOA: 12/27/2018  PCP: Default, Provider, MD  Admit date: 12/27/2018 Discharge date: 12/29/2018  Admitted From: Home Disposition: Home  Recommendations for Outpatient Follow-up:  1. Follow up with PCP in 1 week with repeat CBC/BMP 2. Outpatient follow-up with GI 3. Follow up in ED if symptoms worsen or new appear   Home Health: No Equipment/Devices: None  Discharge Condition: Stable CODE STATUS: Full Diet recommendation: Heart healthy  Brief/Interim Summary: 63 year old female with history of hypertension, hyperlipidemia, peptic ulcer disease, bipolar disorder and schizophrenia was transferred from [place] on 12/27/2018 with complaints of coffee-ground emesis and dark stools.  Hemoglobin was 10.4.  She was started on intravenous Protonix and IV fluids.  She was transferred to [facility] for GI evaluation.  She underwent upper GI endoscopy on 12/27/2018.  She was transfused 1 unit of packed red cells on 12/28/2018.  No further episodes of coffee-ground emesis or melena.  Hemoglobin is stable.  GI has cleared the patient for discharge.  She will be discharged home on oral Protonix once a day.  Discharge Diagnoses:   Upper GI bleeding most likely secondary to duodenal ulcer in a patient with peptic ulcer disease Acute blood loss anemia -Status post upper GI endoscopy on 12/27/2018 which showed nonbleeding duodenal ulcer with clean ulcer base along with acute gastritis.    Treated with intravenous Protonix. -Hemoglobin 6.8 on 12/28/2018 for which she was transfused 1 unit of packed red cell.  No overnight black or bloody bowel movements or vomiting.  -Hemoglobin 8.6 today. -Tolerating diet.  GI has signed off and has recommended Protonix 40 mg daily.  Discharge home on the same with outpatient follow-up with GI. -H. Pylori CLO testing negative  Acute kidney injury -Probably prerenal from above -Improving.   Treated with IV fluids.  Outpatient follow-up.  Probable UTI -Cultures negative so far.    Treated with Rocephin.  No need for further antibiotics.  Leukocytosis -Resolved  Minimally elevated liver enzymes -Improving  Essential hypertension -Blood pressure on the lower side but stable.  Continue amlodipine and isosorbide dinitrate along with metoprolol.    Will keep Benicar on hold on discharge as well.  Outpatient follow-up.  Mood disorder: Bipolar disorder/schizophrenia -Continue lithium along with hydroxyzine as needed for anxiety  Hyperlipidemia -Continue atorvastatin  Discharge Instructions  Discharge Instructions    Ambulatory referral to Gastroenterology   Complete by: As directed    GI bleed followup   Diet - low sodium heart healthy   Complete by: As directed    Increase activity slowly   Complete by: As directed      Allergies as of 12/29/2018      Reactions   Ibuprofen Other (See Comments)   PT has HX of bleeding ulcers and  Can not take ibuprofen      Medication List    STOP taking these medications   aspirin 81 MG chewable tablet   diclofenac 75 MG EC tablet Commonly known as: VOLTAREN   olmesartan 40 MG tablet Commonly known as: BENICAR     TAKE these medications   amLODipine 10 MG tablet Commonly known as: NORVASC Take 10 mg by mouth daily.   atorvastatin 20 MG tablet Commonly known as: LIPITOR Take 20 mg by mouth at bedtime.   ferrous sulfate 324 MG Tbec Take 324 mg by mouth daily with breakfast.   gabapentin 600 MG tablet Commonly known as: NEURONTIN Take 600 mg by mouth 3 (three)  times daily.   hydrOXYzine 25 MG tablet Commonly known as: ATARAX/VISTARIL Take 25 mg by mouth 3 (three) times daily as needed for anxiety.   isosorbide dinitrate 30 MG tablet Commonly known as: ISORDIL Take 30 mg by mouth 2 (two) times daily.   lithium carbonate 300 MG capsule Take 300 mg by mouth 2 (two) times daily with a meal.   Melatonin  10 MG Tabs Take 10 mg by mouth at bedtime as needed (sleep).   metoprolol succinate 25 MG 24 hr tablet Commonly known as: TOPROL-XL Take 12.5 mg by mouth 2 (two) times a day.   nitroGLYCERIN 0.4 MG SL tablet Commonly known as: NITROSTAT Place 0.4 mg under the tongue every 5 (five) minutes as needed for chest pain.   ondansetron 4 MG tablet Commonly known as: ZOFRAN Take 1 tablet (4 mg total) by mouth every 6 (six) hours as needed for nausea.   pantoprazole 40 MG tablet Commonly known as: Protonix Take 1 tablet (40 mg total) by mouth daily.   senna 8.6 MG tablet Commonly known as: SENOKOT Take 1 tablet by mouth daily.      Follow-up Information    PCP. Schedule an appointment as soon as possible for a visit in 1 week(s).   Why: with repeat cbc/bmp         Allergies  Allergen Reactions  . Ibuprofen Other (See Comments)    PT has HX of bleeding ulcers and  Can not take ibuprofen    Consultations:  GI   Procedures/Studies: EGD Impression: - Non-bleeding duodenal ulcer with a clean ulcer  base (Forrest Class III). There was sig edema and  difficult to see ulcer base but was able to catch a  few glimpses of it. - Acute gastritis. Biopsied. - Non-obstructing Schatzki ring. - The examination was otherwise normal. Recommendation: - Patient has a contact number available for  emergencies. The signs and symptoms of potential  delayed complications were discussed with the  patient. Return to normal activities tomorrow.  Written discharge instructions were provided to the  patient. - Advance diet as tolerated. -  Continue present medications. - When tolerating po well change to oral PPI - qd  is fine Await CLO test Home 1-2 days depending upon clinical course   Subjective: Patient seen and examined at bedside.  Denied any overnight fever, nausea, vomiting, black or bloody stools.  Discharge Exam: Vitals:   12/29/18 0548 12/29/18 0909  BP: 119/64 115/69  Pulse: (!) 51 64  Resp: 18 18  Temp: 98.4 F (36.9 C) 98.6 F (37 C)  SpO2: 99% 99%    General: Pt is alert, awake, not in acute distress Cardiovascular: rate controlled, S1/S2 + Respiratory: bilateral decreased breath sounds at bases Abdominal: Soft, NT, ND, bowel sounds + Extremities: no edema, no cyanosis    The results of significant diagnostics from this hospitalization (including imaging, microbiology, ancillary and laboratory) are listed below for reference.     Microbiology: No results found for this or any previous visit (from the past 240 hour(s)).   Labs: BNP (last 3 results) No results for input(s): BNP in the last 8760 hours. Basic Metabolic Panel: Recent Labs  Lab 12/27/18 0738 12/28/18 0246 12/29/18 0605  NA 140 140 141  K 3.7 4.0 4.2  CL 108 112* 111  CO2 23 23 24   GLUCOSE 114* 119* 106*  BUN 61* 26* 20  CREATININE 1.27* 0.97 1.23*  CALCIUM 8.8* 8.3* 8.6*  MG  --   --  1.8   Liver Function Tests: Recent Labs  Lab 12/27/18 0738 12/28/18 0246  AST 50* 28  ALT 70* 48*  ALKPHOS 63 50  BILITOT 0.7 0.7  PROT 6.3* 5.1*  ALBUMIN 3.4* 2.6*   No results for input(s): LIPASE, AMYLASE in the last 168 hours. No results for input(s): AMMONIA in the last 168 hours. CBC: Recent Labs  Lab 12/27/18 0738 12/28/18 0246 12/28/18 1455 12/29/18 0605  WBC 9.8 9.1  --  6.2  NEUTROABS  --   --   --  3.9  HGB 9.1* 6.8* 10.4* 8.9*  HCT 28.6* 21.1*  --  27.6*  MCV 98.6 100.0  --  95.8  PLT 244 181  --  160    Cardiac Enzymes: No results for input(s): CKTOTAL, CKMB, CKMBINDEX, TROPONINI in the last 168 hours. BNP: Invalid input(s): POCBNP CBG: No results for input(s): GLUCAP in the last 168 hours. D-Dimer No results for input(s): DDIMER in the last 72 hours. Hgb A1c No results for input(s): HGBA1C in the last 72 hours. Lipid Profile No results for input(s): CHOL, HDL, LDLCALC, TRIG, CHOLHDL, LDLDIRECT in the last 72 hours. Thyroid function studies No results for input(s): TSH, T4TOTAL, T3FREE, THYROIDAB in the last 72 hours.  Invalid input(s): FREET3 Anemia work up No results for input(s): VITAMINB12, FOLATE, FERRITIN, TIBC, IRON, RETICCTPCT in the last 72 hours. Urinalysis    Component Value Date/Time   COLORURINE YELLOW 12/27/2018 1602   APPEARANCEUR HAZY (A) 12/27/2018 1602   LABSPEC 1.018 12/27/2018 1602   PHURINE 7.0 12/27/2018 1602   GLUCOSEU NEGATIVE 12/27/2018 1602   HGBUR NEGATIVE 12/27/2018 1602   BILIRUBINUR NEGATIVE 12/27/2018 1602   KETONESUR NEGATIVE 12/27/2018 1602   PROTEINUR NEGATIVE 12/27/2018 1602   NITRITE POSITIVE (A) 12/27/2018 1602   LEUKOCYTESUR LARGE (A) 12/27/2018 1602   Sepsis Labs Invalid input(s): PROCALCITONIN,  WBC,  LACTICIDVEN Microbiology No results found for this or any previous visit (from the past 240 hour(s)).   Time coordinating discharge: 35 minutes  SIGNED:   Glade LloydKshitiz Zamia Tyminski, MD  [facility] 12/29/2018, 10:04 AM

## 2018-12-29 NOTE — Plan of Care (Signed)
  Problem: Education: Goal: Knowledge of General Education information will improve Description Including pain rating scale, medication(s)/side effects and non-pharmacologic comfort measures Outcome: Progressing
